# Patient Record
Sex: Female | Born: 1990 | Race: White | Hispanic: No | Marital: Single | Smoking: Current every day smoker
Health system: Southern US, Community
[De-identification: ages and names within clinical notes are randomized; demographics above are authoritative.]

---
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
## 2012-02-13 ENCOUNTER — Emergency Department: Payer: Self-pay | Admitting: Emergency Medicine

## 2012-03-13 ENCOUNTER — Emergency Department: Payer: Self-pay | Admitting: *Deleted
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
## 2014-03-07 ENCOUNTER — Emergency Department: Payer: Self-pay | Admitting: Emergency Medicine

## 2015-11-24 ENCOUNTER — Encounter: Payer: Self-pay | Admitting: Emergency Medicine

## 2015-11-24 ENCOUNTER — Emergency Department
Admission: EM | Admit: 2015-11-24 | Discharge: 2015-11-24 | Disposition: A | Discharge status: Home / Self Care | Payer: Self-pay | Attending: Emergency Medicine | Admitting: Emergency Medicine

## 2015-11-24 DIAGNOSIS — H6981 Other specified disorders of Eustachian tube, right ear: Secondary | ICD-10-CM

## 2015-11-24 DIAGNOSIS — F17210 Nicotine dependence, cigarettes, uncomplicated: Secondary | ICD-10-CM | POA: Insufficient documentation

## 2015-11-24 DIAGNOSIS — H6991 Unspecified Eustachian tube disorder, right ear: Secondary | ICD-10-CM | POA: Insufficient documentation

## 2015-11-24 MED ORDER — CETIRIZINE HCL 10 MG PO TABS: 10.0000 mg | ORAL_TABLET | Freq: Every day | ORAL | Status: DC

## 2015-11-24 MED ORDER — FLUTICASONE PROPIONATE 50 MCG/ACT NA SUSP: 1.0000 | Freq: Two times a day (BID) | NASAL | Status: DC

## 2015-11-24 NOTE — ED Provider Notes (Signed)
[facility] Emergency Department Provider Note  ____________________________________________  Time seen: Approximately 2:25 PM  I have reviewed the triage vital signs and the nursing notes.   HISTORY  Chief Complaint Otalgia    HPI Teresa Sampson is a 25 y.o. female who presents emergency department complaining of right ear "fullness and pressure." Patient states that it sounds muffled when both she and others talk. She denies any true pain to the area. She denies any fevers or chills. She denies any nasal congestion, sore throat, cough. Patient is tried ibuprofen with no relief.Patient is not inserted anything into her ear and denies any barotrauma.   History reviewed. No pertinent past medical history.  There are no active problems to display for this patient.   History reviewed. No pertinent past surgical history.  Current Outpatient Rx  Name  Route  Sig  Dispense  Refill  . cetirizine (ZYRTEC) 10 MG tablet   Oral   Take 1 tablet (10 mg total) by mouth daily.   30 tablet   0   . fluticasone (FLONASE) 50 MCG/ACT nasal spray   Each Nare   Place 1 spray into both nostrils 2 (two) times daily.   16 g   0     Allergies Review of patient's allergies indicates no known allergies.  No family history on file.  Social History Social History  Substance Use Topics  . Smoking status: Current Every Day Smoker -- 1.00 packs/day    Types: Cigarettes  . Smokeless tobacco: Never Used  . Alcohol Use: No     Review of Systems  Constitutional: No fever/chills Eyes: No visual changes. No discharge ENT: No sore throat. Positive for right ear fullness. Negative for nasal congestion. Cardiovascular: no chest pain. Respiratory: no cough. No SOB. Skin: Negative for rash. Neurological: Negative for headaches, focal weakness or numbness. 10-point ROS otherwise negative.  ____________________________________________   PHYSICAL EXAM:  VITAL  SIGNS: ED Triage Vitals  Enc Vitals Group     BP 11/24/15 1307 110/60 mmHg     Pulse Rate 11/24/15 1307 102     Resp 11/24/15 1307 16     Temp 11/24/15 1307 98 F (36.7 C)     Temp Source 11/24/15 1307 Oral     SpO2 11/24/15 1307 100 %     Weight 11/24/15 1307 100 lb (45.36 kg)     Height 11/24/15 1307  (1.575 m)     Head Cir --      Peak Flow --      Pain Score 11/24/15 1309 0     Pain Loc --      Pain Edu? --      Excl. in GC? --      Constitutional: Alert and oriented. Well appearing and in no acute distress. Eyes: Conjunctivae are normal. PERRL. EOMI. Head: Atraumatic. ENT:      Ears: EACs unremarkable bilaterally. TM on left is unremarkable. TM on right is moderately bulging but no air-fluid level is identified.      Nose: No congestion/rhinnorhea.      Mouth/Throat: Mucous membranes are moist.  Neck: No stridor.   Hematological/Lymphatic/Immunilogical: No cervical lymphadenopathy. Cardiovascular: Normal rate, regular rhythm. Normal S1 and S2.  Good peripheral circulation. Respiratory: Normal respiratory effort without tachypnea or retractions. Lungs CTAB. Neurologic:  Normal speech and language. No gross focal neurologic deficits are appreciated.  Skin:  Skin is warm, dry and intact. No rash noted. Psychiatric: Mood and affect are normal. Speech  and behavior are normal. Patient exhibits appropriate insight and judgement.   ____________________________________________   LABS (all labs ordered are listed, but only abnormal results are displayed)  Labs Reviewed - No data to display ____________________________________________  EKG   ____________________________________________  RADIOLOGY   No results found.  ____________________________________________    PROCEDURES  Procedure(s) performed:       Medications - No data to display   ____________________________________________   INITIAL IMPRESSION / ASSESSMENT AND PLAN / ED  COURSE  Pertinent labs & imaging results that were available during my care of the patient were reviewed by me and considered in my medical decision making (see chart for details).  Patient's diagnosis is consistent with eustachian tube dysfunction on the right. Patient denies any other symptoms complaints. No signs of infection at this time.. Patient will be discharged home with prescriptions for Zyrtec and Flonase. Patient is to follow up with primary care provider if symptoms persist past this treatment course. Patient is given ED precautions to return to the ED for any worsening or new symptoms.     ____________________________________________  FINAL CLINICAL IMPRESSION(S) / ED DIAGNOSES  Final diagnoses:  Eustachian tube dysfunction, right      NEW MEDICATIONS STARTED DURING THIS VISIT:  New Prescriptions   CETIRIZINE (ZYRTEC) 10 MG TABLET    Take 1 tablet (10 mg total) by mouth daily.   FLUTICASONE (FLONASE) 50 MCG/ACT NASAL SPRAY    Place 1 spray into both nostrils 2 (two) times daily.        Delorise Royals Serenitee Fuertes, PA-C 11/24/15 1431  Jeanmarie Plant, MD 11/24/15 [phone]

## 2015-11-24 NOTE — ED Notes (Signed)
C/o right ear pressure x 3 days.  Also c/o "muffled sounds" to right ear.

## 2015-11-24 NOTE — Discharge Instructions (Signed)

## 2016-06-26 ENCOUNTER — Emergency Department
Admission: EM | Admit: 2016-06-26 | Discharge: 2016-06-26 | Disposition: A | Discharge status: Home / Self Care | Payer: Self-pay | Attending: Emergency Medicine | Admitting: Emergency Medicine

## 2016-06-26 ENCOUNTER — Encounter: Payer: Self-pay | Admitting: Emergency Medicine

## 2016-06-26 DIAGNOSIS — Z7951 Long term (current) use of inhaled steroids: Secondary | ICD-10-CM | POA: Insufficient documentation

## 2016-06-26 DIAGNOSIS — Z79899 Other long term (current) drug therapy: Secondary | ICD-10-CM | POA: Insufficient documentation

## 2016-06-26 DIAGNOSIS — J039 Acute tonsillitis, unspecified: Secondary | ICD-10-CM | POA: Insufficient documentation

## 2016-06-26 DIAGNOSIS — F17210 Nicotine dependence, cigarettes, uncomplicated: Secondary | ICD-10-CM | POA: Insufficient documentation

## 2016-06-26 LAB — POCT RAPID STREP A: Streptococcus, Group A Screen (Direct): NEGATIVE

## 2016-06-26 MED ORDER — FIRST-DUKES MOUTHWASH MT SUSP: 5.0000 mL | Freq: Four times a day (QID) | OROMUCOSAL | 0 refills | Status: DC | PRN

## 2016-06-26 MED ORDER — AMOXICILLIN 400 MG/5ML PO SUSR: 875.0000 mg | Freq: Two times a day (BID) | ORAL | 0 refills | Status: DC

## 2016-06-26 NOTE — ED Provider Notes (Signed)
[facility] Emergency Department Provider Note  ____________________________________________  Time seen: Approximately 7:55 AM  I have reviewed the triage vital signs and the nursing notes.   HISTORY  Chief Complaint Sore Throat    HPI Teresa Sampson is a 25 y.o. female , NAD, presents to the emergency department today history of sore throat and fevers. States she had a fever of 102.12F 2 nights ago. Took ibuprofen which alleviated the fever. Has had sore throat and Eisenstein patches on her tonsils since that time. Denies any nasal congestion, runny nose, ear pain, ear drainage, sinus pressure, headache, cough or chest congestion. Denies any abdominal pain, nausea, vomiting. Has not noted any rashes. No known sick contacts.    History reviewed. No pertinent past medical history.  There are no active problems to display for this patient.   History reviewed. No pertinent surgical history.  Prior to Admission medications   Medication Sig Start Date End Date Taking? Authorizing Provider  amoxicillin (AMOXIL) 400 MG/5ML suspension Take 10.9 mLs (875 mg total) by mouth 2 (two) times daily. 06/26/16   Anielle Headrick L Abria Vannostrand, PA-C  cetirizine (ZYRTEC) 10 MG tablet Take 1 tablet (10 mg total) by mouth daily. 11/24/15   Delorise RoyalsJonathan D Cuthriell, PA-C  Diphenhyd-Hydrocort-Nystatin (FIRST-DUKES MOUTHWASH) SUSP Use as directed 5 mLs in the mouth or throat 4 (four) times daily as needed (for throat pain). 06/26/16   Paizlie Klaus L Sahar Ryback, PA-C  fluticasone (FLONASE) 50 MCG/ACT nasal spray Place 1 spray into both nostrils 2 (two) times daily. 11/24/15   Delorise RoyalsJonathan D Cuthriell, PA-C    Allergies Review of patient's allergies indicates no known allergies.  No family history on file.  Social History Social History  Substance Use Topics  . Smoking status: Current Every Day Smoker    Packs/day: 1.00    Types: Cigarettes  . Smokeless tobacco: Never Used  . Alcohol use No     Review of Systems   Constitutional: Positive fever with MAXIMUM TEMPERATURE of 102.12F. No chills, rigors, fatigue. Eyes: No visual changes. No discharge, redness, pain ENT: Positive sore throat with Hachey patches on tonsils. No sinus pressure, ear pain, ear drainage, nasal congestion, runny nose Cardiovascular: No chest pain. Respiratory: No cough or chest congestion. No shortness of breath. No wheezing.  Gastrointestinal: No abdominal pain.  No nausea, vomiting.   Musculoskeletal: Negative for general myalgias.  Skin: Negative for rash. Neurological: Negative for headaches. 10-point ROS otherwise negative.  ____________________________________________   PHYSICAL EXAM:  VITAL SIGNS: ED Triage Vitals  Enc Vitals Group     BP 06/26/16 0747 113/69     Pulse Rate 06/26/16 0747 (!) 125     Resp 06/26/16 0747 16     Temp 06/26/16 0747 98.1 F (36.7 C)     Temp Source 06/26/16 0747 Oral     SpO2 06/26/16 0747 98 %     Weight 06/26/16 0748 100 lb (45.4 kg)     Height 06/26/16 0748 5\' 1"  (1.549 m)     Head Circumference --      Peak Flow --      Pain Score 06/26/16 0748 7     Pain Loc --      Pain Edu? --      Excl. in GC? --     Constitutional: Alert and oriented. Well appearing and in no acute distress. Eyes: Conjunctivae are normal without icterus or injection. No drainage. Head: Atraumatic. ENT:      Nose: No congestion/rhinnorhea.  Mouth/Throat: Bilateral tonsils with mild erythema, Corning exudate and trace swelling but a patent airway. No postnasal drip. Uvula is midline. Mucous membranes are moist.  Neck: No stridor, carotid bruits. Supple with full range of motion. Hematological/Lymphatic/Immunilogical: Positive right, anterior, focal cervical lymphadenopathy without pain to palpation and is mobile. Cardiovascular: Normal rate, with manual pulse measured at 98 bpm, regular rhythm. Normal S1 and S2.  Good peripheral circulation. Respiratory: Normal respiratory effort without tachypnea or  retractions. Lungs CTAB with breath sounds noted in all lung fields. No wheeze, rhonchi, rales. Neurologic:  Normal speech and language. No gross focal neurologic deficits are appreciated.  Skin:  Skin is warm, dry and intact. No rash noted. Psychiatric: Mood and affect are normal. Speech and behavior are normal. Patient exhibits appropriate insight and judgement.   ____________________________________________   LABS (all labs ordered are listed, but only abnormal results are displayed)  Labs Reviewed  POCT RAPID STREP A   ____________________________________________  EKG  None ____________________________________________  RADIOLOGY  None ____________________________________________    PROCEDURES  Procedure(s) performed: None   Procedures   Medications - No data to display   ____________________________________________   INITIAL IMPRESSION / ASSESSMENT AND PLAN / ED COURSE  Pertinent labs & imaging results that were available during my care of the patient were reviewed by me and considered in my medical decision making (see chart for details).  Clinical Course  Comment By Time  Pulse rate taken manually and noted to be 98 bpm. Hope Pigeon, PA-C 09/17 0840  Considering patient has physical exam finding of tonsillitis, I will be treating her with amoxicillin to cover for potential strep. Strep culture would not be sent to lab today considering patient would be treated today. Hope Pigeon, PA-C 09/17 1610    Patient's diagnosis is consistent with Acute tonsillitis. 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 Patient will be discharged home with prescriptions for liquid amoxicillin as well as first Dukes Magic mouthwash to take as directed.  May continue Tylenol or ibuprofen over-the-counter as needed for fever or aches. Patient is to follow up with [facility] community  clinic if symptoms persist past this treatment course. Patient is given ED precautions to return to the ED for any worsening or new symptoms.    ____________________________________________  FINAL CLINICAL IMPRESSION(S) / ED DIAGNOSES  Final diagnoses:  Acute tonsillitis, unspecified etiology      NEW MEDICATIONS STARTED DURING THIS VISIT:  Discharge Medication List as of 06/26/2016  8:40 AM    START taking these medications   Details  amoxicillin (AMOXIL) 400 MG/5ML suspension Take 10.9 mLs (875 mg total) by mouth 2 (two) times daily., Starting Sun 06/26/2016, Print    Diphenhyd-Hydrocort-Nystatin (FIRST-DUKES MOUTHWASH) SUSP Use as directed 5 mLs in the mouth or throat 4 (four) times daily as needed (for throat pain)., Starting Sun 06/26/2016, [employer], PA-C 06/26/16 9604    Sharman Cheek, MD 06/28/16 2231

## 2016-06-26 NOTE — ED Notes (Signed)
Sore throat / fever x2 days , last ibuprofen 4 hours PTA

## 2016-06-26 NOTE — ED Triage Notes (Signed)
C/O sore throat and fever x 2 days.  Ibuprofen last taken this morning at 0430

## 2016-06-26 NOTE — ED Notes (Signed)
Pt in no distress, ambulatory

## 2016-06-26 NOTE — ED Notes (Signed)
Provider at bedside

## 2017-09-23 ENCOUNTER — Emergency Department
Admission: EM | Admit: 2017-09-23 | Discharge: 2017-09-23 | Disposition: A | Discharge status: Home / Self Care | Payer: Self-pay | Attending: Emergency Medicine | Admitting: Emergency Medicine

## 2017-09-23 ENCOUNTER — Other Ambulatory Visit: Payer: Self-pay

## 2017-09-23 ENCOUNTER — Encounter: Payer: Self-pay | Admitting: Physician Assistant

## 2017-09-23 DIAGNOSIS — K029 Dental caries, unspecified: Secondary | ICD-10-CM | POA: Insufficient documentation

## 2017-09-23 DIAGNOSIS — F17210 Nicotine dependence, cigarettes, uncomplicated: Secondary | ICD-10-CM | POA: Insufficient documentation

## 2017-09-23 MED ORDER — IBUPROFEN 800 MG PO TABS: 800.0000 mg | ORAL_TABLET | Freq: Three times a day (TID) | ORAL | 0 refills | Status: DC | PRN

## 2017-09-23 MED ORDER — AMOXICILLIN 500 MG PO CAPS: 500.0000 mg | ORAL_CAPSULE | Freq: Three times a day (TID) | ORAL | 0 refills | Status: DC

## 2017-09-23 NOTE — ED Triage Notes (Signed)
Patient reports pain to left lower jaw.

## 2017-09-23 NOTE — Discharge Instructions (Signed)
OPTIONS FOR DENTAL FOLLOW UP CARE ° °[place] Department of Health and Human Services - Local Safety Net Dental Clinics °http://www.ncdhhs.gov/dph/oralhealth/services/safetynetclinics.htm °  °[facility] (336-562-3123) ° °[facility] [place] (919-933-9087) ° °[facility] ([phone] ext [phone]) ° °[facility] ([phone]) ° °[facility] ([phone]) °This clinic caters to the indigent population and is on a lottery system. °Location: °[facility], [address], [place] °Clinic Hours: °Wednesdays from 6pm - 9pm, patients seen by a lottery system. °For dates, call or go to www.med.[facility].edu/[facility]/patients/Dental-[facility] °Services: °Cleanings, fillings and simple extractions. °Payment Options: °DENTAL WORK IS FREE OF CHARGE. Bring proof of income or support. °Best way to get seen: °Arrive at 5:15 pm - this is a lottery, NOT first come/first serve, so arriving earlier will not increase your chances of being seen. °  °  °[facility] °[phone] °Select option 1 for emergencies °  °Location: °[facility], [address], [place] °Clinic Hours: °No walk-ins accepted - call the day before to schedule an appointment. °Check in times are 9:30 am and 1:30 pm. °Services: °Simple extractions, temporary fillings, pulpectomy/pulp debridement, uncomplicated abscess drainage. °Payment Options: °PAYMENT IS DUE AT THE TIME OF SERVICE.  Fee is usually $100-200, additional surgical procedures (e.g. abscess drainage) may be extra. °Cash, checks, Visa/MasterCard accepted.  Can file Medicaid if patient is covered for dental - patient should call case worker to check. °No discount for [facility] patients. °Best way to get seen: °MUST call the day before and get onto the schedule. Can usually be seen the next 1-2 days. No walk-ins accepted. °  °  °[facility] °[phone] °   °Location: °[facility], [address], [place] °Clinic Hours: °M, W, Th, F 8am or 1:30pm, Tues 9a or 1:30 - first come/first served. °Services: °Simple extractions, temporary fillings, uncomplicated abscess drainage.  You do not need to be an [place] resident. °Payment Options: °PAYMENT IS DUE AT THE TIME OF SERVICE. °Dental insurance, otherwise sliding scale - bring proof of income or support. °Depending on income and treatment needed, cost is usually $50-200. °Best way to get seen: °Arrive early as it is first come/first served. °  °  °[facility] °[phone] °  °Location: °[address] °Clinic Hours: °Mon-Thu 8a-5p °Services: °Most basic dental services including extractions and fillings. °Payment Options: °PAYMENT IS DUE AT THE TIME OF SERVICE. °Sliding scale, up to 50% off - bring proof if income or support. °Medicaid with dental option accepted. °Best way to get seen: °Call to schedule an appointment, can usually be seen within 2 weeks OR they will try to see walk-ins - show up at 8a or 2p (you may have to wait). °  °  °[facility] °919-245-2435 °[place] RESIDENTS ONLY °  °Location: °[facility], [address], [place], [place] [address] °Clinic Hours: By appointment only. °Monday - Thursday 8am-5pm, Friday 8am-12pm °Services: Cleanings, fillings, extractions. °Payment Options: °PAYMENT IS DUE AT THE TIME OF SERVICE. °Cash, Visa or MasterCard. Sliding scale - $30 minimum per service. °Best way to get seen: °Come in to office, complete packet and make an appointment - need proof of income °or support monies for each household member and proof of [place] residence. °Usually takes about a month to get in. °  °  °[facility] °[phone] °  °Location: °[address]   [place] °Clinic Hours: Walk-in Urgent Care Dental Services are offered Monday-Friday  mornings only. °The numbers of emergencies accepted daily is limited to the number of °providers available. °Maximum 15 - Mondays, Wednesdays & Thursdays °Maximum 10 - Tuesdays & Fridays °Services: °You do not need to be a [place] resident to be seen for a dental emergency. °Emergencies are defined as pain, swelling, abnormal bleeding, or dental trauma. Walkins will receive x-rays if needed. °NOTE: Dental cleaning is not an emergency. °Payment Options: °PAYMENT IS DUE AT THE TIME OF SERVICE. °Minimum co-pay is $40.00 for uninsured patients. °Minimum co-pay is $3.00 for Medicaid with dental coverage. °Dental Insurance is accepted and must be presented at time of visit. °Medicare does not cover dental. °Forms of payment: Cash, credit card, checks. °Best way to get seen: °If not previously registered with the clinic, walk-in dental registration begins at 7:15 am and is on a first come/first serve basis. °If previously registered with the clinic, call to make an appointment. °  °  °[facility] °[phone] °[place] RESIDENTS ONLY °  °Location: °[address], [place], [place] °Clinic Hours: °Mon-Thu 10a-2p °Services: Extractions only! °Payment Options: °FREE (donations accepted) - bring proof of income or support °Best way to get seen: °Call and schedule an appointment OR come at 8am on the 1st Monday of every month (except for holidays) when it is first come/first served. °  °  °[facility] °[phone] °  °Location: °[address], [place] °Clinic Hours: °Friday mornings °Services, Payment Options, Best way to get seen: °Call for info °

## 2017-09-23 NOTE — ED Provider Notes (Signed)
[facility] Emergency Department Provider Note  ____________________________________________   First MD Initiated Contact with Patient 09/23/17 2256     (approximate)  I have reviewed the triage vital signs and the nursing notes.   HISTORY  Chief Complaint Dental Pain    HPI Teresa Sampson is a 26 y.o. female complains of swelling to the left lower jaw, she states she has several cavities that need to be addressed, she denies fever, chills, chest pain or shortness of breath, she does not have a regular dentist   History reviewed. No pertinent past medical history.  There are no active problems to display for this patient.   History reviewed. No pertinent surgical history.  Prior to Admission medications   Medication Sig Start Date End Date Taking? Authorizing Provider  amoxicillin (AMOXIL) 500 MG capsule Take 1 capsule (500 mg total) by mouth 3 (three) times daily. 09/23/17   Kenyata Napier, Roselyn BeringSusan W, PA-C  ibuprofen (ADVIL,MOTRIN) 800 MG tablet Take 1 tablet (800 mg total) by mouth every 8 (eight) hours as needed. 09/23/17   Faythe GheeFisher, Jennipher Weatherholtz W, PA-C    Allergies Patient has no known allergies.  No family history on file.  Social History Social History   Tobacco Use  . Smoking status: Current Every Day Smoker    Packs/day: 1.00    Types: Cigarettes  . Smokeless tobacco: Never Used  Substance Use Topics  . Alcohol use: No  . Drug use: No    Review of Systems  Constitutional: No fever/chills Eyes: No visual changes. ENT: No sore throat.  Positive for tooth pain and jaw swelling Respiratory: Denies cough Genitourinary: Negative for dysuria. Musculoskeletal: Negative for back pain. Skin: Negative for rash.    ____________________________________________   PHYSICAL EXAM:  VITAL SIGNS: ED Triage Vitals  Enc Vitals Group     BP 09/23/17 2239 116/73     Pulse Rate 09/23/17 2239 (!) 107     Resp 09/23/17 2239 18     Temp 09/23/17 2239  98.8 F (37.1 C)     Temp Source 09/23/17 2239 Oral     SpO2 09/23/17 2239 99 %     Weight 09/23/17 2239 110 lb (49.9 kg)     Height 09/23/17 2239 5\' 2"  (1.575 m)     Head Circumference --      Peak Flow --      Pain Score 09/23/17 2252 2     Pain Loc --      Pain Edu? --      Excl. in GC? --     Constitutional: Alert and oriented. Well appearing and in no acute distress. Eyes: Conjunctivae are normal.  Head: Atraumatic.  Mild swelling on the left mandible Nose: No congestion/rhinnorhea. Mouth/Throat: Mucous membranes are moist.  Throat is normal, patient has poor dentition, several dental caries throughout her mouth, the gum is swollen on the left lower molar Cardiovascular: Normal rate, regular rhythm.  Heart sounds are normal Respiratory: Normal respiratory effort.  No retractions lungs are clear to auscultation,  GU: deferred Musculoskeletal: FROM all extremities, warm and well perfused Neurologic:  Normal speech and language.  Skin:  Skin is warm, dry and intact. No rash noted. Psychiatric: Mood and affect are normal. Speech and behavior are normal.  ____________________________________________   LABS (all labs ordered are listed, but only abnormal results are displayed)  Labs Reviewed - No data to display ____________________________________________   ____________________________________________  RADIOLOGY    ____________________________________________   PROCEDURES  Procedure(s)  performed: No      ____________________________________________   INITIAL IMPRESSION / ASSESSMENT AND PLAN / ED COURSE  Pertinent labs & imaging results that were available during my care of the patient were reviewed by me and considered in my medical decision making (see chart for details).  Patient is a 26 year old female with severe dental caries, she does not have a regular dentist, there is swelling along the left lower gum at the molar, minimal swelling at the left  jaw, with prescription for amoxicillin 500 mg 3 times a day for 10 days, ibuprofen 800 mg 3 times a day with food, patient was given follow-up information for dental clinics in the area, she states she will call and follow-up, she was discharged in stable condition      ____________________________________________   FINAL CLINICAL IMPRESSION(S) / ED DIAGNOSES  Final diagnoses:  Pain due to dental caries      NEW MEDICATIONS STARTED DURING THIS VISIT:  This SmartLink is deprecated. Use AVSMEDLIST instead to display the medication list for a patient.   Note:  This document was prepared using Dragon voice recognition software and may include unintentional dictation errors.    Faythe GheeFisher, Miken Stecher W, PA-C 09/23/17 2307    Jeanmarie PlantMcShane, James A, MD 09/24/17 2226

## 2018-07-19 ENCOUNTER — Emergency Department
Admission: EM | Admit: 2018-07-19 | Discharge: 2018-07-19 | Disposition: A | Discharge status: Home / Self Care | Payer: Self-pay | Attending: Emergency Medicine | Admitting: Emergency Medicine

## 2018-07-19 ENCOUNTER — Encounter: Payer: Self-pay | Admitting: Emergency Medicine

## 2018-07-19 ENCOUNTER — Other Ambulatory Visit: Payer: Self-pay

## 2018-07-19 DIAGNOSIS — F17210 Nicotine dependence, cigarettes, uncomplicated: Secondary | ICD-10-CM | POA: Insufficient documentation

## 2018-07-19 DIAGNOSIS — K047 Periapical abscess without sinus: Secondary | ICD-10-CM

## 2018-07-19 MED ORDER — AMOXICILLIN 250 MG/5ML PO SUSR
1000.0000 mg | Freq: Once | ORAL | Status: AC
  Administered 2018-07-19: 1000 mg via ORAL
  Filled 2018-07-19: qty 20

## 2018-07-19 MED ORDER — MAGIC MOUTHWASH W/LIDOCAINE: 5.0000 mL | Freq: Four times a day (QID) | ORAL | 0 refills | Status: DC

## 2018-07-19 MED ORDER — AMOXICILLIN 400 MG/5ML PO SUSR: 1000.0000 mg | Freq: Two times a day (BID) | ORAL | 0 refills | Status: DC

## 2018-07-19 NOTE — ED Notes (Signed)
See triage note  Present with possible dental abscess and pain to left side  sxs' started 2 days ago

## 2018-07-19 NOTE — ED Provider Notes (Signed)
[facility] Emergency Department Provider Note  ____________________________________________  Time seen: Approximately 2:20 PM  I have reviewed the triage vital signs and the nursing notes.   HISTORY  Chief Complaint No chief complaint on file.    HPI Teresa Sampson is a 27 y.o. female who presents the emergency department complaining of left lower dental pain and swelling.  Patient reports that she has poor dentition, but does not have a dentist.  She reports that she has had dental infections in the past with similar symptoms.  No fevers or chills, nasal congestion, sore throat, difficulty breathing or swallowing.  Patient is tried Motrin and BC powder at home for symptomatic control.  2 days of symptoms.  No other complaints at this time.    History reviewed. No pertinent past medical history.  There are no active problems to display for this patient.   History reviewed. No pertinent surgical history.  Prior to Admission medications   Medication Sig Start Date End Date Taking? Authorizing Provider  amoxicillin (AMOXIL) 400 MG/5ML suspension Take 12.5 mLs (1,000 mg total) by mouth 2 (two) times daily. 07/19/18   Nimra Puccinelli, Delorise Royals, PA-C  ibuprofen (ADVIL,MOTRIN) 800 MG tablet Take 1 tablet (800 mg total) by mouth every 8 (eight) hours as needed. 09/23/17   Faythe Ghee, PA-C  magic mouthwash w/lidocaine SOLN Take 5 mLs by mouth 4 (four) times daily. 07/19/18   Rebacca Votaw, Delorise Royals, PA-C    Allergies Patient has no known allergies.  No family history on file.  Social History Social History   Tobacco Use  . Smoking status: Current Every Day Smoker    Packs/day: 1.00    Types: Cigarettes  . Smokeless tobacco: Never Used  Substance Use Topics  . Alcohol use: No  . Drug use: No     Review of Systems  Constitutional: No fever/chills Eyes: No visual changes. No discharge ENT: Positive for left lower dental pain and  swelling. Cardiovascular: no chest pain. Respiratory: no cough. No SOB. Gastrointestinal: No abdominal pain.  No nausea, no vomiting.  Mild musculoskeletal: Negative for musculoskeletal pain. Skin: Negative for rash, abrasions, lacerations, ecchymosis. Neurological: Negative for headaches, focal weakness or numbness. 10-point ROS otherwise negative.  ____________________________________________   PHYSICAL EXAM:  VITAL SIGNS: ED Triage Vitals  Enc Vitals Group     BP 07/19/18 1258 107/64     Pulse Rate 07/19/18 1258 (!) 105     Resp 07/19/18 1258 18     Temp 07/19/18 1258 98.8 F (37.1 C)     Temp Source 07/19/18 1258 Oral     SpO2 07/19/18 1258 100 %     Weight 07/19/18 1259 99 lb (44.9 kg)     Height 07/19/18 1259 5\' 3"  (1.6 m)     Head Circumference --      Peak Flow --      Pain Score 07/19/18 1300 8     Pain Loc --      Pain Edu? --      Excl. in GC? --      Constitutional: Alert and oriented. Well appearing and in no acute distress. Eyes: Conjunctivae are normal. PERRL. EOMI. Head: Atraumatic. ENT:      Ears:       Nose: No congestion/rhinnorhea.      Mouth/Throat: Mucous membranes are moist.  Poor dentition throughout with multiple dental erosions, cavities.  Patient does have erythema and edema surrounding multiple left lower teeth.  No fluctuance or induration.  No purulent drainage.  No indication of deep space infection or Ludwig angina.  Uvula is midline. Neck: No stridor.   Hematological/Lymphatic/Immunilogical: No cervical lymphadenopathy. Cardiovascular: Normal rate, regular rhythm. Normal S1 and S2.  Good peripheral circulation. Respiratory: Normal respiratory effort without tachypnea or retractions. Lungs CTAB. Good air entry to the bases with no decreased or absent breath sounds. Musculoskeletal: Full range of motion to all extremities. No gross deformities appreciated. Neurologic:  Normal speech and language. No gross focal neurologic deficits are  appreciated.  Skin:  Skin is warm, dry and intact. No rash noted. Psychiatric: Mood and affect are normal. Speech and behavior are normal. Patient exhibits appropriate insight and judgement.   ____________________________________________   LABS (all labs ordered are listed, but only abnormal results are displayed)  Labs Reviewed - No data to display ____________________________________________  EKG   ____________________________________________  RADIOLOGY   No results found.  ____________________________________________    PROCEDURES  Procedure(s) performed:    Procedures    Medications  amoxicillin (AMOXIL) 250 MG/5ML suspension 1,000 mg (has no administration in time range)     ____________________________________________   INITIAL IMPRESSION / ASSESSMENT AND PLAN / ED COURSE  Pertinent labs & imaging results that were available during my care of the patient were reviewed by me and considered in my medical decision making (see chart for details).  Review of the [place] CSRS was performed in accordance of the [facility] prior to dispensing any controlled drugs.      Patient's diagnosis is consistent with dental infection.  Patient presents the emergency department complaining of left lower dental pain and swelling.  Findings are consistent with dental infection.  No indication for labs or imaging at this time.  Patient is unable to swallow pills and request liquid medication.  Patient will be prescribed amoxicillin, Magic mouthwash.  Follow-up with dentist..  Patient is given ED precautions to return to the ED for any worsening or new symptoms.     ____________________________________________  FINAL CLINICAL IMPRESSION(S) / ED DIAGNOSES  Final diagnoses:  Dental abscess      NEW MEDICATIONS STARTED DURING THIS VISIT:  ED Discharge Orders         Ordered    magic mouthwash w/lidocaine SOLN  4 times daily    Note to Pharmacy:  Dispense in a 1/1/1 ratio. Use  lidocaine, diphenhydramine, prednisolone   07/19/18 1428    amoxicillin (AMOXIL) 400 MG/5ML suspension  2 times daily     07/19/18 1428              This chart was dictated using voice recognition software/Dragon. Despite best efforts to proofread, errors can occur which can change the meaning. Any change was purely unintentional.    Racheal Patches, PA-C 07/19/18 1430    Dionne Bucy, MD 07/19/18 1534

## 2018-07-19 NOTE — ED Triage Notes (Signed)
Pt c/o dental pain to LFT side x2 days, denies fever. VSS

## 2018-07-19 NOTE — Discharge Instructions (Signed)
OPTIONS FOR DENTAL FOLLOW UP CARE ° °[place] Department of Health and Human Services - Local Safety Net Dental Clinics °http://www.ncdhhs.gov/dph/oralhealth/services/safetynetclinics.htm °  °[facility] ([phone]) ° °[facility] [place] ([phone]) ° °[facility] ([phone]) ° °[facility] ([phone]) ° °[facility] ([phone]) °This clinic caters to the indigent population and is on a lottery system. °Location: °[facility], [address], [place] °Clinic Hours: °Wednesdays from 6pm - 9pm, patients seen by a lottery system. °For dates, call or go to [email or web address] °Services: °Cleanings, fillings and simple extractions. °Payment Options: °DENTAL WORK IS FREE OF CHARGE. Bring proof of income or support. °Best way to get seen: °Arrive at 5:15 pm - this is a lottery, NOT first come/first serve, so arriving earlier will not increase your chances of being seen. °  °  °[facility] °[phone] °Select option 1 for emergencies °  °Location: °[facility], [address], [place] °Clinic Hours: °No walk-ins accepted - call the day before to schedule an appointment. °Check in times are 9:30 am and 1:30 pm. °Services: °Simple extractions, temporary fillings, pulpectomy/pulp debridement, uncomplicated abscess drainage. °Payment Options: °PAYMENT IS DUE AT THE TIME OF SERVICE.  Fee is usually $100-200, additional surgical procedures (e.g. abscess drainage) may be extra. °Cash, checks, Visa/MasterCard accepted.  Can file Medicaid if patient is covered for dental - patient should call case worker to check. °No discount for [facility] patients. °Best way to get seen: °MUST call the day before and get onto the schedule. Can usually be seen the next 1-2 days. No walk-ins accepted. °  °  °[facility] °[phone] °   °Location: °[facility], [address], [place] °Clinic Hours: °M, W, Th, F 8am or 1:30pm, Tues 9a or 1:30 - first come/first served. °Services: °Simple extractions, temporary fillings, uncomplicated abscess drainage.  You do not need to be an [place] resident. °Payment Options: °PAYMENT IS DUE AT THE TIME OF SERVICE. °Dental insurance, otherwise sliding scale - bring proof of income or support. °Depending on income and treatment needed, cost is usually $50-200. °Best way to get seen: °Arrive early as it is first come/first served. °  °  °[facility] °[phone] °  °Location: °[address] °Clinic Hours: °Mon-Thu 8a-5p °Services: °Most basic dental services including extractions and fillings. °Payment Options: °PAYMENT IS DUE AT THE TIME OF SERVICE. °Sliding scale, up to 50% off - bring proof if income or support. °Medicaid with dental option accepted. °Best way to get seen: °Call to schedule an appointment, can usually be seen within 2 weeks OR they will try to see walk-ins - show up at 8a or 2p (you may have to wait). °  °  °[facility] °[phone] °[place] RESIDENTS ONLY °  °Location: °[facility], [address], [place], [place] [address] °Clinic Hours: By appointment only. °Monday - Thursday 8am-5pm, Friday 8am-12pm °Services: Cleanings, fillings, extractions. °Payment Options: °PAYMENT IS DUE AT THE TIME OF SERVICE. °Cash, Visa or MasterCard. Sliding scale - $30 minimum per service. °Best way to get seen: °Come in to office, complete packet and make an appointment - need proof of income °or support monies for each household member and proof of [place] residence. °Usually takes about a month to get in. °  °  °[facility] °[phone] °  °Location: °[address]   [place] °Clinic Hours: Walk-in Urgent Care Dental Services are offered Monday-Friday  mornings only. °The numbers of emergencies accepted daily is limited to the number of °providers available. °Maximum 15 - Mondays, Wednesdays & Thursdays °Maximum 10 - Tuesdays & Fridays °Services: °You do not need to be a [place] resident to be seen for a dental emergency. °Emergencies are defined as pain, swelling, abnormal bleeding, or dental trauma. Walkins will receive x-rays if needed. °NOTE: Dental cleaning is not an emergency. °Payment Options: °PAYMENT IS DUE AT THE TIME OF SERVICE. °Minimum co-pay is $40.00 for uninsured patients. °Minimum co-pay is $3.00 for Medicaid with dental coverage. °Dental Insurance is accepted and must be presented at time of visit. °Medicare does not cover dental. °Forms of payment: Cash, credit card, checks. °Best way to get seen: °If not previously registered with the clinic, walk-in dental registration begins at 7:15 am and is on a first come/first serve basis. °If previously registered with the clinic, call to make an appointment. °  °  °[facility] °[phone] °[place] RESIDENTS ONLY °  °Location: °[address], [place], [place] °Clinic Hours: °Mon-Thu 10a-2p °Services: Extractions only! °Payment Options: °FREE (donations accepted) - bring proof of income or support °Best way to get seen: °Call and schedule an appointment OR come at 8am on the 1st Monday of every month (except for holidays) when it is first come/first served. °  °  °[facility] °[phone] °  °Location: °[address], [place] °Clinic Hours: °Friday mornings °Services, Payment Options, Best way to get seen: °Call for info °

## 2018-09-11 ENCOUNTER — Other Ambulatory Visit: Payer: Self-pay

## 2018-09-11 ENCOUNTER — Emergency Department
Admission: EM | Admit: 2018-09-11 | Discharge: 2018-09-11 | Disposition: A | Discharge status: Home / Self Care | Payer: Self-pay | Attending: Emergency Medicine | Admitting: Emergency Medicine

## 2018-09-11 DIAGNOSIS — K029 Dental caries, unspecified: Secondary | ICD-10-CM | POA: Insufficient documentation

## 2018-09-11 DIAGNOSIS — F17210 Nicotine dependence, cigarettes, uncomplicated: Secondary | ICD-10-CM | POA: Insufficient documentation

## 2018-09-11 MED ORDER — IBUPROFEN 800 MG PO TABS: 800.0000 mg | ORAL_TABLET | Freq: Three times a day (TID) | ORAL | 0 refills | Status: DC | PRN

## 2018-09-11 MED ORDER — AMOXICILLIN 500 MG PO CAPS: 500.0000 mg | ORAL_CAPSULE | Freq: Three times a day (TID) | ORAL | 0 refills | Status: DC

## 2018-09-11 NOTE — ED Triage Notes (Signed)
C/o right dental pain, facial pressure. Pt alert and oriented X4, active, cooperative, pt in NAD. RR even and unlabored, color WNL.

## 2018-09-11 NOTE — ED Notes (Addendum)
FIRST NURSE NOTE: Pt to ED reporting pain in sinuses and jaw pain since last night with swelling noted to the right side of pts jaw. Voice clear in lobby. No SOB or increased WOB. NO throat swelling.

## 2018-09-11 NOTE — ED Provider Notes (Signed)
[facility] Emergency Department Provider Note  ____________________________________________   First MD Initiated Contact with Patient 09/11/18 1504     (approximate)  I have reviewed the triage vital signs and the nursing notes.   HISTORY  Chief Complaint Facial Pain and Dental Pain    HPI Teresa Sampson is a 27 y.o. female presents to the emergency department complaining of right lower dental pain and sinus pressure.  She denies any fever or chills.  She denies any swelling.  States she is very anxious due to the being here in the ED.  She is not requesting any narcotics.    History reviewed. No pertinent past medical history.  There are no active problems to display for this patient.   History reviewed. No pertinent surgical history.  Prior to Admission medications   Medication Sig Start Date End Date Taking? Authorizing Provider  amoxicillin (AMOXIL) 500 MG capsule Take 1 capsule (500 mg total) by mouth 3 (three) times daily. 09/11/18   Circe Chilton, Roselyn BeringSusan W, PA-C  ibuprofen (ADVIL,MOTRIN) 800 MG tablet Take 1 tablet (800 mg total) by mouth every 8 (eight) hours as needed. 09/11/18   Faythe GheeFisher, Erik Nessel W, PA-C    Allergies Patient has no known allergies.  No family history on file.  Social History Social History   Tobacco Use  . Smoking status: Current Every Day Smoker    Packs/day: 1.00    Types: Cigarettes  . Smokeless tobacco: Never Used  Substance Use Topics  . Alcohol use: No  . Drug use: No    Review of Systems  Constitutional: No fever/chills Eyes: No visual changes. ENT: No sore throat.  Positive dental pain and sinus pressure Respiratory: Denies cough Genitourinary: Negative for dysuria. Musculoskeletal: Negative for back pain. Skin: Negative for rash.    ____________________________________________   PHYSICAL EXAM:  VITAL SIGNS: ED Triage Vitals  Enc Vitals Group     BP 09/11/18 1455 (!) 101/55     Pulse Rate 09/11/18  1455 (!) 111     Resp 09/11/18 1455 18     Temp 09/11/18 1455 97.7 F (36.5 C)     Temp Source 09/11/18 1455 Oral     SpO2 09/11/18 1455 98 %     Weight 09/11/18 1456 100 lb (45.4 kg)     Height 09/11/18 1456 5\' 2"  (1.575 m)     Head Circumference --      Peak Flow --      Pain Score 09/11/18 1456 5     Pain Loc --      Pain Edu? --      Excl. in GC? --     Constitutional: Alert and oriented. Well appearing and in no acute distress. Eyes: Conjunctivae are normal.  Head: Atraumatic.  Maxillary sinuses are tender to palpation Nose: No congestion/rhinnorhea. Mouth/Throat: Mucous membranes are moist.  Positive for poor dentition. Neck:  supple no lymphadenopathy noted Cardiovascular: Normal rate, regular rhythm. Heart sounds are normal Respiratory: Normal respiratory effort.  No retractions, lungs c t a  GU: deferred Musculoskeletal: FROM all extremities, warm and well perfused Neurologic:  Normal speech and language.  Skin:  Skin is warm, dry and intact. No rash noted. Psychiatric: Mood and affect are normal. Speech and behavior are normal.  ____________________________________________   LABS (all labs ordered are listed, but only abnormal results are displayed)  Labs Reviewed - No data to display ____________________________________________   ____________________________________________  RADIOLOGY    ____________________________________________   PROCEDURES  Procedure(s) performed: No  Procedures    ____________________________________________   INITIAL IMPRESSION / ASSESSMENT AND PLAN / ED COURSE  Pertinent labs & imaging results that were available during my care of the patient were reviewed by me and considered in my medical decision making (see chart for details).   Patient is 27 year old female presents for sinus pain and dental pain. Physical exam shows poor dentition.  Maxillary sinuses are minimally tender Explained findings to the patient.  She  is given prescription for amoxicillin ibuprofen.  She is to follow-up when the dental clinics to have the tooth extracted.  She states she understands will comply.  She is discharged in stable condition.     As part of my medical decision making, I reviewed the following data within the electronic MEDICAL RECORD NUMBER Nursing notes reviewed and incorporated, Old chart reviewed, Notes from prior ED visits and [place] Controlled Substance Database  ____________________________________________   FINAL CLINICAL IMPRESSION(S) / ED DIAGNOSES  Final diagnoses:  Pain due to dental caries      NEW MEDICATIONS STARTED DURING THIS VISIT:  New Prescriptions   AMOXICILLIN (AMOXIL) 500 MG CAPSULE    Take 1 capsule (500 mg total) by mouth 3 (three) times daily.   IBUPROFEN (ADVIL,MOTRIN) 800 MG TABLET    Take 1 tablet (800 mg total) by mouth every 8 (eight) hours as needed.     Note:  This document was prepared using Dragon voice recognition software and may include unintentional dictation errors.    Faythe Ghee, PA-C 09/11/18 1657    Emily Filbert, MD 09/12/18 [phone]

## 2018-09-11 NOTE — ED Notes (Signed)
See triage note  States she felt like she her upper teeth may be infected  Also having some swelling pain to right jaw area  No fever

## 2018-09-11 NOTE — Discharge Instructions (Addendum)
Follow-up with 1 of the following dental clinics or [facility].  Take medication as prescribed.  OPTIONS FOR DENTAL FOLLOW UP CARE  [place] Department of Health and Human Services - Local Safety Net Dental Clinics TripDoors.comhttp://www.ncdhhs.gov/dph/oralhealth/services/safetynetclinics.htm   [facility] [phone])  [facility] [phone]  [facility] [phone])  [facility] [phone])  [facility] [phone]) This clinic caters to the indigent population and is on a lottery system. Location: [employer] [employer] [facility], [address], [place] [facility] Hours: Wednesdays from 6pm - 9pm, patients seen by a lottery system. For dates, call or go to [email or web address] Services: Cleanings, fillings and simple extractions. Payment Options: DENTAL WORK IS FREE OF CHARGE. Bring proof of income or support. Best way to get seen: Arrive at 5:15 pm - this is a lottery, NOT first come/first serve, so arriving earlier will not increase your chances of being seen.     [facility] [phone] Select option 1 for emergencies   Location: [facility], [address], [place] [facility] Hours: No walk-ins accepted - call the day before to schedule an appointment. Check in times are 9:30 am and 1:30 pm. Services: Simple extractions, temporary fillings, pulpectomy/pulp debridement, uncomplicated abscess drainage. Payment Options: PAYMENT IS DUE AT THE TIME OF SERVICE.  Fee is usually $100-200, additional surgical procedures (e.g. abscess drainage) may be extra. Cash, checks, Visa/MasterCard accepted.  Can file Medicaid if patient is covered for dental - patient should call case worker to check. No discount for [facility] patients. Best way to get seen: MUST call the day before and get onto the schedule. Can usually be seen  the next 1-2 days. No walk-ins accepted.     [facility] [phone]   Location: [facility], [address], [place] [facility] Hours: M, W, Th, F 8am or 1:30pm, Tues 9a or 1:30 - first come/first served. Services: Simple extractions, temporary fillings, uncomplicated abscess drainage.  You do not need to be an [place] resident. Payment Options: PAYMENT IS DUE AT THE TIME OF SERVICE. Dental insurance, otherwise sliding scale - bring proof of income or support. Depending on income and treatment needed, cost is usually $50-200. Best way to get seen: Arrive early as it is first come/first served.     [facility] [phone]   Location: [address] Clinic Hours: Mon-Thu 8a-5p Services: Most basic dental services including extractions and fillings. Payment Options: PAYMENT IS DUE AT THE TIME OF SERVICE. Sliding scale, up to 50% off - bring proof if income or support. Medicaid with dental option accepted. Best way to get seen: Call to schedule an appointment, can usually be seen within 2 weeks OR they will try to see walk-ins - show up at 8a or 2p (you may have to wait).     [facility] [phone] [place]IDENTS ONLY   Location: [facility], [address], [place], [place] [address] Clinic Hours: By appointment only. Monday - Thursday 8am-5pm, Friday 8am-12pm Services: Cleanings, fillings, extractions. Payment Options: PAYMENT IS DUE AT THE TIME OF SERVICE. Cash, Visa or MasterCard. Sliding scale - $30 minimum per service. Best way to get seen: Come in to office, complete packet and make an appointment - need proof of income or support monies for each household member and proof of [place] residence. Usually takes about a month to get  in.     [facility] [phone]   Location: [place]  [place] [facility] Hours: Walk-in Urgent Care Dental Services are offered Monday-Friday mornings only. The numbers of emergencies accepted daily is limited to the number of providers available. Maximum 15 - Mondays, Wednesdays & Thursdays Maximum 10 - Tuesdays & Fridays Services: You do not need to be a [facility] resident to be seen for a dental emergency. Emergencies are defined as pain, swelling, abnormal bleeding, or dental trauma. Walkins will receive x-rays if needed. NOTE: Dental cleaning is not an emergency. Payment Options: PAYMENT IS DUE AT THE TIME OF SERVICE. Minimum co-pay is $40.00 for uninsured patients. Minimum co-pay is $3.00 for Medicaid with dental coverage. Dental Insurance is accepted and must be presented at time of visit. Medicare does not cover dental. Forms of payment: Cash, credit card, checks. Best way to get seen: If not previously registered with the clinic, walk-in dental registration begins at 7:15 am and is on a first come/first serve basis. If previously registered with the clinic, call to make an appointment.     [facility] [place] ONLY   Location: [address] [place], [place] Clinic Hours: Mon-Thu 10a-2p Services: Extractions only! Payment Options: FREE (donations accepted) - bring proof of income or support Best way to get seen: Call and schedule an appointment OR come at 8am on the 1st Monday of every month (except for holidays) when it is first come/first served.     [facility] [phone]   Location: [place], [place] Clinic Hours: Friday mornings Services, Payment Options, Best way to get seen: Call for info

## 2019-03-07 ENCOUNTER — Encounter: Payer: Self-pay | Admitting: Emergency Medicine

## 2019-03-07 ENCOUNTER — Emergency Department
Admission: EM | Admit: 2019-03-07 | Discharge: 2019-03-07 | Disposition: A | Discharge status: Home / Self Care | Payer: Self-pay | Attending: Emergency Medicine | Admitting: Emergency Medicine

## 2019-03-07 ENCOUNTER — Other Ambulatory Visit: Payer: Self-pay

## 2019-03-07 DIAGNOSIS — F17210 Nicotine dependence, cigarettes, uncomplicated: Secondary | ICD-10-CM | POA: Insufficient documentation

## 2019-03-07 DIAGNOSIS — K0889 Other specified disorders of teeth and supporting structures: Secondary | ICD-10-CM | POA: Insufficient documentation

## 2019-03-07 MED ORDER — ACETAMINOPHEN-CODEINE 120-12 MG/5ML PO SUSP: 10.0000 mL | Freq: Four times a day (QID) | ORAL | 0 refills | Status: DC | PRN

## 2019-03-07 MED ORDER — AMOXICILLIN 500 MG PO CAPS: 500.0000 mg | ORAL_CAPSULE | Freq: Three times a day (TID) | ORAL | 0 refills | Status: DC

## 2019-03-07 NOTE — Discharge Instructions (Signed)
Please call and schedule a dental appointment as soon as possible. You will need to be seen within the next 14 days. Return to the emergency department for symptoms that change or worsen if you're unable to schedule an appointment. ° °OPTIONS FOR DENTAL FOLLOW UP CARE ° °[place] Department of Health and Human Services - Local Safety Net Dental Clinics °http://www.ncdhhs.gov/dph/oralhealth/services/safetynetclinics.htm °  °[facility] ([phone]) ° °[facility] ([phone]) ° °[facility] ([phone]) ° °[facility] ([phone]) ° °[facility] ([phone]) °This clinic caters to the indigent population and is on a lottery system. °Location: °[facility], [address], [place] °Clinic Hours: °Wednesdays from 6pm - 9pm, patients seen by a lottery system. °For dates, call or go to [email or web address] °Services: °Cleanings, fillings and simple extractions. °Payment Options: °DENTAL WORK IS FREE OF CHARGE. Bring proof of income or support. °Best way to get seen: °Arrive at 5:15 pm - this is a lottery, NOT first come/first serve, so arriving earlier will not increase your chances of being seen. °  °  °[facility] °[phone] °Select option 1 for emergencies °  °Location: °[facility], [address], [place] °Clinic Hours: °No walk-ins accepted - call the day before to schedule an appointment. °Check in times are 9:30 am and 1:30 pm. °Services: °Simple extractions, temporary fillings, pulpectomy/pulp debridement, uncomplicated abscess drainage. °Payment Options: °PAYMENT IS DUE AT THE TIME OF SERVICE.  Fee is usually $100-200, additional surgical procedures (e.g. abscess drainage) may be extra. °Cash, checks, Visa/MasterCard accepted.  Can file Medicaid if patient is covered for dental - patient should call case worker to check. °No  discount for [facility] patients. °Best way to get seen: °MUST call the day before and get onto the schedule. Can usually be seen the next 1-2 days. No walk-ins accepted. °  °  °[facility] °[phone] °  °Location: °[facility], [address], [place] °Clinic Hours: °M, W, Th, F 8am or 1:30pm, Tues 9a or 1:30 - first come/first served. °Services: °Simple extractions, temporary fillings, uncomplicated abscess drainage.  You do not need to be an [place] resident. °Payment Options: °PAYMENT IS DUE AT THE TIME OF SERVICE. °Dental insurance, otherwise sliding scale - bring proof of income or support. °Depending on income and treatment needed, cost is usually $50-200. °Best way to get seen: °Arrive early as it is first come/first served. °  °  °[facility] °[phone] °  °Location: °[address] °Clinic Hours: °Mon-Thu 8a-5p °Services: °Most basic dental services including extractions and fillings. °Payment Options: °PAYMENT IS DUE AT THE TIME OF SERVICE. °Sliding scale, up to 50% off - bring proof if income or support. °Medicaid with dental option accepted. °Best way to get seen: °Call to schedule an appointment, can usually be seen within 2 weeks OR they will try to see walk-ins - show up at 8a or 2p (you may have to wait). °  °  °[facility] °[phone] °[place] RESIDENTS ONLY °  °Location: °[facility], [address], [place], [place] [address] °Clinic Hours: By appointment only. °Monday - Thursday 8am-5pm, Friday 8am-12pm °Services: Cleanings, fillings, extractions. °Payment Options: °PAYMENT IS DUE AT THE TIME OF SERVICE. °Cash, Visa or MasterCard. Sliding scale - $30 minimum per service. °Best way to get seen: °Come in to office, complete packet and make an appointment -   need proof of income °or support monies for each household member and proof of [place]  residence. °Usually takes about a month to get in. °  °  °[facility] °[phone] °  °Location: °[address] [place] °Clinic Hours: Walk-in Urgent Care Dental Services are offered Monday-Friday mornings only. °The numbers of emergencies accepted daily is limited to the number of °providers available. °Maximum 15 - Mondays, Wednesdays & Thursdays °Maximum 10 - Tuesdays & Fridays °Services: °You do not need to be a [place] resident to be seen for a dental emergency. °Emergencies are defined as pain, swelling, abnormal bleeding, or dental trauma. Walkins will receive x-rays if needed. °NOTE: Dental cleaning is not an emergency. °Payment Options: °PAYMENT IS DUE AT THE TIME OF SERVICE. °Minimum co-pay is $40.00 for uninsured patients. °Minimum co-pay is $3.00 for Medicaid with dental coverage. °Dental Insurance is accepted and must be presented at time of visit. °Medicare does not cover dental. °Forms of payment: Cash, credit card, checks. °Best way to get seen: °If not previously registered with the clinic, walk-in dental registration begins at 7:15 am and is on a first come/first serve basis. °If previously registered with the clinic, call to make an appointment. °  °  °[facility] °[phone] °[place] RESIDENTS ONLY °  °Location: °[address], [place], [place] °Clinic Hours: °Mon-Thu 10a-2p °Services: Extractions only! °Payment Options: °FREE (donations accepted) - bring proof of income or support °Best way to get seen: °Call and schedule an appointment OR come at 8am on the 1st Monday of every month (except for holidays) when it is first come/first served. °  °  °[facility] °[phone] °  °Location: °[address], [place] °Clinic Hours: °Friday mornings °Services, Payment Options, Best way to get seen: °Call for info ° °Periodontal Exam °Procedures ° ° ° ° ° °

## 2019-03-07 NOTE — ED Triage Notes (Signed)
Pt c/o pain to right lower teeth since last night. Does not have dentist. Airway intact, controlling secretions. VSS, NAD

## 2019-03-07 NOTE — ED Provider Notes (Signed)
[facility] Emergency Department Provider Note ____________________________________________  Time seen: Approximately 3:21 PM  I have reviewed the triage vital signs and the nursing notes.   HISTORY  Chief Complaint Dental Pain   HPI Teresa Sampson is a 28 y.o. female who presents to the emergency department for treatment and evaluation of dental pain.  Pain has been present for  the past couple of days with worsening over the past 24 hours.  No relief with Tylenol, ibuprofen, and salt water rinse.  She is also tried Orajel with no relief.  She denies fever.  History reviewed. No pertinent past medical history.  There are no active problems to display for this patient.   History reviewed. No pertinent surgical history.  Prior to Admission medications   Medication Sig Start Date End Date Taking? Authorizing Provider  acetaminophen-codeine 120-12 MG/5ML suspension Take 10 mLs by mouth every 6 (six) hours as needed for pain. 03/07/19 03/06/20  Clements Toro, Rulon Eisenmenger B, FNP  amoxicillin (AMOXIL) 500 MG capsule Take 1 capsule (500 mg total) by mouth 3 (three) times daily. 03/07/19   Chinita Pester, FNP    Allergies Patient has no known allergies.  History reviewed. No pertinent family history.  Social History Social History   Tobacco Use  . Smoking status: Current Every Day Smoker    Packs/day: 1.00    Types: Cigarettes  . Smokeless tobacco: Never Used  Substance Use Topics  . Alcohol use: No  . Drug use: No    Review of Systems Constitutional: Negative for fever or recent illness. ENT: Positive for dental pain. Musculoskeletal: Negative for trismus of the jaw.  Skin: Negative for wound or lesion. ____________________________________________   PHYSICAL EXAM:  VITAL SIGNS: ED Triage Vitals  Enc Vitals Group     BP 03/07/19 1302 117/72     Pulse Rate 03/07/19 1302 88     Resp 03/07/19 1302 16     Temp 03/07/19 1302 98.7 F (37.1 C)     Temp  Source 03/07/19 1302 Oral     SpO2 03/07/19 1302 100 %     Weight 03/07/19 1303 100 lb (45.4 kg)     Height 03/07/19 1303 5\' 2"  (1.575 m)     Head Circumference --      Peak Flow --      Pain Score 03/07/19 1303 8     Pain Loc --      Pain Edu? --      Excl. in GC? --     Constitutional: Alert and oriented. Well appearing and in no acute distress. Eyes: Conjunctiva are clear without discharge or drainage. Mouth/Throat: Widespread dental decay Periodontal Exam    Hematological/Lymphatic/Immunilogical: No palpable anterior cervical lymphadenopathy Respiratory: Respirations even and unlabored. Musculoskeletal: Full ROM of the jaw. Neurologic: Awake, alert, oriented.  Skin: No facial erythema or edema observed Psychiatric: Affect and behavior intact.  ____________________________________________   LABS (all labs ordered are listed, but only abnormal results are displayed)  Labs Reviewed - No data to display ____________________________________________   RADIOLOGY  Not indicated. ____________________________________________   PROCEDURES  Procedure(s) performed:   Procedures  Critical Care performed: No ____________________________________________   INITIAL IMPRESSION / ASSESSMENT AND PLAN / ED COURSE  Teresa Sampson is a 28 y.o. female presents to the emergency department for evaluation and treatment of dental pain.  She will be placed on amoxicillin and given Tylenol 3.  Patient requested capsules and suspension as she is unable to swallow tablets.  She was advised that she will need to see dentist within 2 weeks.  She was provided a list of community resources and encouraged to start calling around today for an appointment.  She was encouraged to return to the emergency department for symptoms of change or worsen if she is unable to schedule an appointment.  Pertinent labs & imaging results that were available during my care of the patient were reviewed by me and  considered in my medical decision making (see chart for details).  ____________________________________________   FINAL CLINICAL IMPRESSION(S) / ED DIAGNOSES  Final diagnoses:  Pain, dental    Discharge Medication List as of 03/07/2019  1:50 PM    START taking these medications   Details  acetaminophen-codeine 120-12 MG/5ML suspension Take 10 mLs by mouth every 6 (six) hours as needed for pain., Starting Thu 03/07/2019, Until Fri 03/06/2020, Normal    amoxicillin (AMOXIL) 500 MG capsule Take 1 capsule (500 mg total) by mouth 3 (three) times daily., Starting Thu 03/07/2019, Normal        If controlled substance prescribed during this visit, 12 month history viewed on the NCCSRS prior to issuing an initial prescription for Schedule II or III opiod.  Note:  This document was prepared using Dragon voice recognition software and may include unintentional dictation errors.   Chinita Pesterriplett, Donyell Ding B, FNP 03/07/19 1524    Sharman CheekStafford, Phillip, MD 03/10/19 Marlyne Beards0002

## 2019-03-07 NOTE — ED Notes (Signed)
See triage note  Presents with pain and some gum line swelling to right side  Min swelling noted

## 2019-04-09 ENCOUNTER — Emergency Department
Admission: EM | Admit: 2019-04-09 | Discharge: 2019-04-09 | Disposition: A | Discharge status: Home / Self Care | Payer: Self-pay | Attending: Emergency Medicine | Admitting: Emergency Medicine

## 2019-04-09 ENCOUNTER — Other Ambulatory Visit: Payer: Self-pay

## 2019-04-09 DIAGNOSIS — F17210 Nicotine dependence, cigarettes, uncomplicated: Secondary | ICD-10-CM | POA: Insufficient documentation

## 2019-04-09 DIAGNOSIS — K047 Periapical abscess without sinus: Secondary | ICD-10-CM | POA: Insufficient documentation

## 2019-04-09 MED ORDER — AMOXICILLIN 500 MG PO CAPS: 500.0000 mg | ORAL_CAPSULE | Freq: Three times a day (TID) | ORAL | 0 refills | Status: DC

## 2019-04-09 MED ORDER — IBUPROFEN 600 MG PO TABS: 600.0000 mg | ORAL_TABLET | Freq: Three times a day (TID) | ORAL | 0 refills | Status: DC | PRN

## 2019-04-09 NOTE — ED Triage Notes (Signed)
Pt comes via POV from home with c/o right sided tooth pain. Pt states this started yesterday and was worse this am. Pt states some swelling. Pt states 6/10 pain.

## 2019-04-09 NOTE — Discharge Instructions (Addendum)
Begin taking antibiotic as directed.  Amoxicillin is 3 times a day for the next 10 days.  Ibuprofen as needed for pain.  A list of dental clinics is listed on your discharge papers.  OPTIONS FOR DENTAL FOLLOW UP CARE  [place] Department of Health and Human Services - Local Safety Net Dental Clinics TripDoors.comhttp://www.ncdhhs.gov/dph/oralhealth/services/safetynetclinics.htm   [facility] [phone]  [facility] [phone])  [facility] [phone])  [facility] [phone])  [facility] [phone]) This clinic caters to the indigent population and is on a lottery system. Location: [employer] [facility], [address], [place] Clinic Hours: Wednesdays from 6pm - 9pm, patients seen by a lottery system. For dates, call or go to [email or web address] Services: Cleanings, fillings and simple extractions. Payment Options: DENTAL WORK IS FREE OF CHARGE. Bring proof of income or support. Best way to get seen: Arrive at 5:15 pm - this is a lottery, NOT first come/first serve, so arriving earlier will not increase your chances of being seen.     [facility] [phone] Select option 1 for emergencies   [facility]: [facility], [address], [place] Clinic Hours: No walk-ins accepted - call the day before to schedule an appointment. Check in times are 9:30 am and 1:30 pm. Services: Simple extractions, temporary fillings, pulpectomy/pulp debridement, uncomplicated abscess drainage. Payment Options: PAYMENT IS DUE AT THE TIME OF SERVICE.  Fee is usually $100-200, additional surgical procedures (e.g. abscess drainage) may be extra. Cash, checks, Visa/MasterCard accepted.  Can file Medicaid if patient is covered for dental - patient should call case worker to check. No discount for [facility] patients. Best way to  get seen: MUST call the day before and get onto the schedule. Can usually be seen the next 1-2 days. No walk-ins accepted.     [facility] [phone]   Location: [facility], [address], [place] Clinic Hours: M, W, Th, F 8am or 1:30pm, Tues 9a or 1:30 - first come/first served. Services: Simple extractions, temporary fillings, uncomplicated abscess drainage.  You do not need to be an [place] resident. Payment Options: PAYMENT IS DUE AT THE TIME OF SERVICE. Dental insurance, otherwise sliding scale - bring proof of income or support. Depending on income and treatment needed, cost is usually $50-200. Best way to get seen: Arrive early as it is first come/first served.     [facility] [phone]   Location: [address] Clinic Hours: Mon-Thu 8a-5p Services: Most basic dental services including extractions and fillings. Payment Options: PAYMENT IS DUE AT THE TIME OF SERVICE. Sliding scale, up to 50% off - bring proof if income or support. Medicaid with dental option accepted. Best way to get seen: Call to schedule an appointment, can usually be seen within 2 weeks OR they will try to see walk-ins - show up at 8a or 2p (you may have to wait).     [facility] [phone] [place]IDENTS ONLY   Location: [facility], [address], [place], [place] [id number] Clinic Hours: By appointment only. Monday - Thursday 8am-5pm, Friday 8am-12pm Services: Cleanings, fillings, extractions. Payment Options: PAYMENT IS DUE AT THE TIME OF SERVICE. Cash, Visa or MasterCard. Sliding scale - $30 minimum per service. Best way to get seen: Come in to office, complete packet and make an appointment - need proof of income or  support monies for each household member and proof of [facility] residence. Usually takes about a month to get in.     [facility] [phone]   Location: [address] [place] Clinic Hours: Walk-in Urgent Care Dental Services are offered Monday-Friday mornings only. The numbers of emergencies accepted daily is limited to the number of providers available. Maximum 15 - Mondays, Wednesdays & Thursdays Maximum 10 - Tuesdays & Fridays Services: You do not need to be a [facility] resident to be seen for a dental emergency. Emergencies are defined as pain, swelling, abnormal bleeding, or dental trauma. Walkins will receive x-rays if needed. NOTE: Dental cleaning is not an emergency. Payment Options: PAYMENT IS DUE AT THE TIME OF SERVICE. Minimum co-pay is $40.00 for uninsured patients. Minimum co-pay is $3.00 for Medicaid with dental coverage. Dental Insurance is accepted and must be presented at time of visit. Medicare does not cover dental. Forms of payment: Cash, credit card, checks. Best way to get seen: If not previously registered with the clinic, walk-in dental registration begins at 7:15 am and is on a first come/first serve basis. If previously registered with the clinic, call to make an appointment.     [facility] [place] ONLY   Location: [address], [place], [place] Clinic Hours: Mon-Thu 10a-2p Services: Extractions only! Payment Options: FREE (donations accepted) - bring proof of income or support Best way to get seen: Call and schedule an appointment OR come at 8am on the 1st Monday of every month (except for holidays) when it is first come/first served.     [facility] [phone]   Location: [place], [place] Clinic Hours: Friday mornings Services, Payment Options, Best way to get seen: Call for info

## 2019-04-09 NOTE — ED Provider Notes (Signed)
[facility] Emergency Department Provider Note  ____________________________________________   First MD Initiated Contact with Patient 04/09/19 1606     (approximate)  I have reviewed the triage vital signs and the nursing notes.   HISTORY  Chief Complaint Dental Pain  HPI Teresa Sampson is a 28 y.o. female Teresa Sampson to the ED with complaint of right lower dental pain.  Patient states that started yesterday but became worse this morning.  Patient has swelling but no drainage.  She rates her pain as 6/10.     History reviewed. No pertinent past medical history.  There are no active problems to display for this patient.   History reviewed. No pertinent surgical history.  Prior to Admission medications   Medication Sig Start Date End Date Taking? Authorizing Provider  amoxicillin (AMOXIL) 500 MG capsule Take 1 capsule (500 mg total) by mouth 3 (three) times daily. 04/09/19   Tommi RumpsSummers, Rhonda L, PA-C  ibuprofen (ADVIL) 600 MG tablet Take 1 tablet (600 mg total) by mouth every 8 (eight) hours as needed. 04/09/19   Tommi RumpsSummers, Rhonda L, PA-C    Allergies Patient has no known allergies.  No family history on file.  Social History Social History   Tobacco Use  . Smoking status: Current Every Day Smoker    Packs/day: 1.00    Types: Cigarettes  . Smokeless tobacco: Never Used  Substance Use Topics  . Alcohol use: No  . Drug use: No    Review of Systems Constitutional: No fever/chills Eyes: No visual changes. ENT: No sore throat.  Positive for dental pain. Cardiovascular: Denies chest pain. Respiratory: Denies shortness of breath. Gastrointestinal:  No nausea, no vomiting.  Musculoskeletal: Negative for back pain. Neurological: Negative for headaches, focal weakness or numbness. ____________________________________________   PHYSICAL EXAM:  VITAL SIGNS: ED Triage Vitals  Enc Vitals Group     BP 04/09/19 1551 108/75     Pulse Rate 04/09/19 1551  (!) 109     Resp 04/09/19 1551 19     Temp 04/09/19 1551 98.1 F (36.7 C)     Temp Source 04/09/19 1551 Oral     SpO2 04/09/19 1551 100 %     Weight 04/09/19 1552 105 lb (47.6 kg)     Height 04/09/19 1552 5\' 2"  (1.575 m)     Head Circumference --      Peak Flow --      Pain Score 04/09/19 1556 5     Pain Loc --      Pain Edu? --      Excl. in GC? --    Constitutional: Alert and oriented. Well appearing and in no acute distress. Eyes: Conjunctivae are normal.  Head: Atraumatic. Nose: No congestion/rhinnorhea. Mouth/Throat: Mucous membranes are moist.  Oropharynx non-erythematous.  Right lower molars are in very poor dental hygiene and repair.  Gums are swollen.  No obvious abscess or drainage noted at this time.  Area is extremely tender to palpation. Neck: No stridor.   Hematological/Lymphatic/Immunilogical: No cervical lymphadenopathy. Cardiovascular: Normal rate, regular rhythm. Grossly normal heart sounds.  Good peripheral circulation. Respiratory: Normal respiratory effort.  No retractions. Lungs CTAB. Musculoskeletal: Moves upper and lower extremities without any difficulty.  Normal gait was noted. Neurologic:  Normal speech and language. No gross focal neurologic deficits are appreciated. No gait instability. Skin:  Skin is warm, dry and intact. No rash noted. Psychiatric: Mood and affect are normal. Speech and behavior are normal.  ____________________________________________   LABS (all labs  ordered are listed, but only abnormal results are displayed)  Labs Reviewed - No data to display  PROCEDURES  Procedure(s) performed (including Critical Care):  Procedures   ____________________________________________   INITIAL IMPRESSION / ASSESSMENT AND PLAN / ED COURSE  As part of my medical decision making, I reviewed the following data within the electronic MEDICAL RECORD NUMBER Notes from prior ED visits and [place] Controlled Substance Database  28 year old female presents  to the ED with complaint of right-sided dental pain that began yesterday and is worse today.  She has been seen in the past for the same but has not seen a dentist for multiple dental caries.  She denies any fever, chills, nausea or vomiting.  Patient has multiple dental caries with gum edema and tenderness noted on the right lower molars.  Patient was given a prescription for amoxicillin and ibuprofen.  She was given a list of dental clinics to also follow-up with. ____________________________________________   FINAL CLINICAL IMPRESSION(S) / ED DIAGNOSES  Final diagnoses:  Dental abscess     ED Discharge Orders         Ordered    amoxicillin (AMOXIL) 500 MG capsule  3 times daily     04/09/19 1624    ibuprofen (ADVIL) 600 MG tablet  Every 8 hours PRN     04/09/19 1624           Note:  This document was prepared using Dragon voice recognition software and may include unintentional dictation errors.    Johnn Hai, PA-C 04/09/19 1649    Merlyn Lot, MD 04/09/19 [phone]

## 2021-06-01 ENCOUNTER — Emergency Department
Admission: EM | Admit: 2021-06-01 | Discharge: 2021-06-01 | Disposition: A | Discharge status: Home / Self Care | Payer: Self-pay | Attending: Emergency Medicine | Admitting: Emergency Medicine

## 2021-06-01 ENCOUNTER — Other Ambulatory Visit: Payer: Self-pay

## 2021-06-01 DIAGNOSIS — L299 Pruritus, unspecified: Secondary | ICD-10-CM | POA: Insufficient documentation

## 2021-06-01 DIAGNOSIS — F17210 Nicotine dependence, cigarettes, uncomplicated: Secondary | ICD-10-CM | POA: Insufficient documentation

## 2021-06-01 DIAGNOSIS — R21 Rash and other nonspecific skin eruption: Secondary | ICD-10-CM

## 2021-06-01 MED ORDER — DOXYCYCLINE HYCLATE 100 MG PO CAPS: 100.0000 mg | ORAL_CAPSULE | Freq: Two times a day (BID) | ORAL | 0 refills | Status: AC

## 2021-06-01 NOTE — ED Provider Notes (Signed)
[facility] Emergency Department Provider Note  ____________________________________________   Event Date/Time   First MD Initiated Contact with Patient 06/01/21 1730     (approximate)  I have reviewed the triage vital signs and the nursing notes.   HISTORY  Chief Complaint No chief complaint on file.    HPI Teresa Sampson is a 30 y.o. female here with rash.  The patient states that her symptoms started as a mildly pruritic area of redness along the left lower back.  She had been outside [place].  She states that over the last several days, she has had an increasing rash that is spread out in an ovoid area around the lesion.  She has noticed some slight itching and mild discomfort when the rash is palpated, but denies any systemic or significant painful symptoms.  Denies any fevers or chills.  No other rash.  No lesions on palms or soles.  No history of similar reactions.  No known spider exposures.  No other complaints.    No past medical history on file.  There are no problems to display for this patient.   No past surgical history on file.  Prior to Admission medications   Medication Sig Start Date End Date Taking? Authorizing Provider  doxycycline (VIBRAMYCIN) 100 MG capsule Take 1 capsule (100 mg total) by mouth 2 (two) times daily for 10 days. 06/01/21 06/11/21 Yes Shaune Pollack, MD  amoxicillin (AMOXIL) 500 MG capsule Take 1 capsule (500 mg total) by mouth 3 (three) times daily. 04/09/19   Tommi Rumps, PA-C  ibuprofen (ADVIL) 600 MG tablet Take 1 tablet (600 mg total) by mouth every 8 (eight) hours as needed. 04/09/19   Tommi Rumps, PA-C    Allergies Patient has no known allergies.  No family history on file.  Social History Social History   Tobacco Use   Smoking status: Every Day    Packs/day: 1.00    Types: Cigarettes   Smokeless tobacco: Never  Substance Use Topics   Alcohol use: No   Drug  use: No    Review of Systems  Review of Systems  Constitutional:  Negative for chills and fever.  HENT:  Negative for sore throat.   Respiratory:  Negative for shortness of breath.   Cardiovascular:  Negative for chest pain.  Gastrointestinal:  Negative for abdominal pain.  Genitourinary:  Negative for flank pain.  Musculoskeletal:  Negative for neck pain.  Skin:  Positive for rash. Negative for wound.  Allergic/Immunologic: Negative for immunocompromised state.  Neurological:  Negative for weakness and numbness.  Hematological:  Does not bruise/bleed easily.  All other systems reviewed and are negative.   ____________________________________________  PHYSICAL EXAM:      VITAL SIGNS: ED Triage Vitals  Enc Vitals Group     BP 06/01/21 1520 129/84     Pulse Rate 06/01/21 1520 (!) 102     Resp 06/01/21 1520 18     Temp 06/01/21 1520 98.3 F (36.8 C)     Temp Source 06/01/21 1520 Oral     SpO2 --      Weight 06/01/21 1521 120 lb (54.4 kg)     Height 06/01/21 1521 5\' 2"  (1.575 m)     Head Circumference --      Peak Flow --      Pain Score 06/01/21 1521 0     Pain Loc --      Pain Edu? --  Excl. in [place]? --      Physical Exam Vitals and nursing note reviewed.  Constitutional:      General: She is not in acute distress.    Appearance: She is well-developed.  HENT:     Head: Normocephalic and atraumatic.  Eyes:     Conjunctiva/sclera: Conjunctivae normal.  Cardiovascular:     Rate and Rhythm: Normal rate and regular rhythm.     Heart sounds: Normal heart sounds.  Pulmonary:     Effort: Pulmonary effort is normal. No respiratory distress.     Breath sounds: No wheezing.  Abdominal:     General: There is no distension.  Musculoskeletal:     Cervical back: Neck supple.  Skin:    General: Skin is warm.     Capillary Refill: Capillary refill takes less than 2 seconds.     Findings: No rash.  Neurological:     Mental Status: She is alert and oriented to person,  place, and time.     Motor: No abnormal muscle tone.     Left flank: erythematous rash along L flank. Small, punctate red area in center surrounded by 1-1.5 cm central clearing followed by erythematous, slightly papular rash in targetoid like distribution. ____________________________________________   LABS (all labs ordered are listed, but only abnormal results are displayed)  Labs Reviewed - No data to display  ____________________________________________  EKG:  ________________________________________  RADIOLOGY All imaging, including plain films, CT scans, and ultrasounds, independently reviewed by me, and interpretations confirmed via formal radiology reads.  ED MD interpretation:     Official radiology report(s): No results found.  ____________________________________________  PROCEDURES   Procedure(s) performed (including Critical Care):  Procedures  ____________________________________________  INITIAL IMPRESSION / MDM / ASSESSMENT AND PLAN / ED COURSE  As part of my medical decision making, I reviewed the following data within the electronic MEDICAL RECORD NUMBER Nursing notes reviewed and incorporated, Old chart reviewed, Notes from prior ED visits, and [place] Controlled Substance Database       *Teresa Sampson was evaluated in Emergency Department on 06/01/2021 for the symptoms described in the history of present illness. She was evaluated in the context of the global COVID-19 pandemic, which necessitated consideration that the patient might be at risk for infection with the SARS-CoV-2 virus that causes COVID-19. Institutional protocols and algorithms that pertain to the evaluation of patients at risk for COVID-19 are in a state of rapid change based on information released by regulatory bodies including the CDC and federal and state organizations. These policies and algorithms were followed during the patient's care in the ED.  Some ED evaluations and interventions may  be delayed as a result of limited staffing during the pandemic.*     Medical Decision Making: Well-appearing 30 year old female here with targetoid rash on the left flank concerning for tick bite/[place] tick exposure.  Differential includes RMSF or less likely Lyme given this occurred in [place].  Clinically, the patient is overall well-appearing and nontoxic.  She is afebrile.  No other rash or skin lesions.  There is no evidence of superimposed cellulitis or abscess.  Will give doxycycline twice daily for 10 days, discharged with outpatient follow-up and good return precautions.  ____________________________________________  FINAL CLINICAL IMPRESSION(S) / ED DIAGNOSES  Final diagnoses:  None     MEDICATIONS GIVEN DURING THIS VISIT:  Medications - No data to display   ED Discharge Orders          Ordered  doxycycline (VIBRAMYCIN) 100 MG capsule  2 times daily        06/01/21 1753             Note:  This document was prepared using Dragon voice recognition software and may include unintentional dictation errors.   Shaune Pollack, MD 06/01/21 [phone]

## 2021-06-01 NOTE — Discharge Instructions (Addendum)
As we discussed, your rash is likely caused by a tick bite.  Take the antibiotic as prescribed  Drink plenty of fluids

## 2021-06-01 NOTE — ED Triage Notes (Signed)
Pt reports that she noticed a bug bite on her left side a few days, they just [place] area. It does itch and it seems to be getting bigger.

## 2022-02-16 ENCOUNTER — Emergency Department
Admission: EM | Admit: 2022-02-16 | Discharge: 2022-02-17 | Disposition: A | Discharge status: Home / Self Care | Payer: Self-pay | Attending: Emergency Medicine | Admitting: Emergency Medicine

## 2022-02-16 ENCOUNTER — Other Ambulatory Visit: Payer: Self-pay

## 2022-02-16 ENCOUNTER — Encounter: Payer: Self-pay | Admitting: *Deleted

## 2022-02-16 DIAGNOSIS — F17210 Nicotine dependence, cigarettes, uncomplicated: Secondary | ICD-10-CM | POA: Insufficient documentation

## 2022-02-16 DIAGNOSIS — R45851 Suicidal ideations: Secondary | ICD-10-CM | POA: Insufficient documentation

## 2022-02-16 DIAGNOSIS — F331 Major depressive disorder, recurrent, moderate: Secondary | ICD-10-CM | POA: Diagnosis present

## 2022-02-16 DIAGNOSIS — F439 Reaction to severe stress, unspecified: Secondary | ICD-10-CM | POA: Insufficient documentation

## 2022-02-16 LAB — COMPREHENSIVE METABOLIC PANEL
ALT: 14 U/L (ref 0–44)
AST: 16 U/L (ref 15–41)
Albumin: 4 g/dL (ref 3.5–5.0)
Alkaline Phosphatase: 49 U/L (ref 38–126)
Anion gap: 4 — ABNORMAL LOW (ref 5–15)
BUN: 7 mg/dL (ref 6–20)
CO2: 27 mmol/L (ref 22–32)
Calcium: 9.1 mg/dL (ref 8.9–10.3)
Chloride: 109 mmol/L (ref 98–111)
Creatinine, Ser: 0.74 mg/dL (ref 0.44–1.00)
GFR, Estimated: >60 mL/min (ref >60)
Glucose, Bld: 100 mg/dL — ABNORMAL HIGH (ref 70–99)
Potassium: 3.7 mmol/L (ref 3.5–5.1)
Sodium: 140 mmol/L (ref 135–145)
Total Bilirubin: 0.3 mg/dL (ref 0.3–1.2)
Total Protein: 6.9 g/dL (ref 6.5–8.1)

## 2022-02-16 LAB — CBC
HCT: 39.1 % (ref 36.0–46.0)
Hemoglobin: 12.6 g/dL (ref 12.0–15.0)
MCH: 29.6 pg (ref 26.0–34.0)
MCHC: 32.2 g/dL (ref 30.0–36.0)
MCV: 92 fL (ref 80.0–100.0)
Platelets: 268 10*3/uL (ref 150–400)
RBC: 4.25 MIL/uL (ref 3.87–5.11)
RDW: 13.2 % (ref 11.5–15.5)
WBC: 9.8 10*3/uL (ref 4.0–10.5)
nRBC: 0 % (ref 0.0–0.2)

## 2022-02-16 LAB — ACETAMINOPHEN LEVEL: Acetaminophen (Tylenol), Serum: <10 ug/mL — ABNORMAL LOW (ref 10–30)

## 2022-02-16 LAB — SALICYLATE LEVEL: Salicylate Lvl: <7 mg/dL — ABNORMAL LOW (ref 7.0–30.0)

## 2022-02-16 LAB — ETHANOL: Alcohol, Ethyl (B): <10 mg/dL (ref <10)

## 2022-02-16 NOTE — ED Provider Notes (Addendum)
? ?[facility] ?Provider Note ? ? ? Event Date/Time  ? First MD Initiated Contact with Patient 02/16/22 2256   ?  (approximate) ? ? ?History  ? ?Psychiatric Evaluation ? ? ?HPI ? ?Teresa Sampson is a 31 y.o. female who presents to the ED for evaluation of Psychiatric Evaluation ?  ?Patient presents to the ED for evaluation of suicidal thoughts earlier today.  She reports having a lot of psychosocial stressors related to her recent divorce.  Reports that she was venting to a friend saying that she would rather just be asleep.  Denies any active suicidal ideations, denies any plans, denies ever attempting to take her life in the past or harm herself.  Denies any audiovisual hallucinations.  Reports feeling better now and is frustrated that her friend told her uncle who made her come get checked out. ? ?Physical Exam  ? ?Triage Vital Signs: ?ED Triage Vitals  ?Enc Vitals Group  ?   BP 02/16/22 2227 105/74  ?   Pulse Rate 02/16/22 2227 79  ?   Resp 02/16/22 2227 18  ?   Temp 02/16/22 2227 98.4 ?F (36.9 ?C)  ?   Temp Source 02/16/22 2227 Oral  ?   SpO2 02/16/22 2227 100 %  ?   Weight 02/16/22 2224 130 lb (59 kg)  ?   Height 02/16/22 2224 5\' 2"  (1.575 m)  ?   Head Circumference --   ?   Peak Flow --   ?   Pain Score 02/16/22 2224 0  ?   Pain Loc --   ?   Pain Edu? --   ?   Excl. in [place]? --   ? ? ?Most recent vital signs: ?Vitals:  ? 02/16/22 2227  ?BP: 105/74  ?Pulse: 79  ?Resp: 18  ?Temp: 98.4 ?F (36.9 ?C)  ?SpO2: 100%  ? ? ?General: Awake, no distress.  Pleasant and conversational in full sentences. ? ?CV:  Good peripheral perfusion.  ?Resp:  Normal effort.  ?Abd:  No distention.  ?MSK:  No deformity noted.  ?Neuro:  No focal deficits appreciated. ?Other:   ? ? ?ED Results / Procedures / Treatments  ? ?Labs ?(all labs ordered are listed, but only abnormal results are displayed) ?Labs Reviewed  ?COMPREHENSIVE METABOLIC PANEL - Abnormal; Notable for the following components:  ?    Result Value  ?  Glucose, Bld 100 (*)   ? Anion gap 4 (*)   ? All other components within normal limits  ?SALICYLATE LEVEL - Abnormal; Notable for the following components:  ? Salicylate Lvl Q000111Q (*)   ? All other components within normal limits  ?ACETAMINOPHEN LEVEL - Abnormal; Notable for the following components:  ? Acetaminophen (Tylenol), Serum <10 (*)   ? All other components within normal limits  ?ETHANOL  ?CBC  ?URINE DRUG SCREEN, QUALITATIVE ([facility] ONLY)  ?POC URINE PREG, ED  ? ? ?EKG ? ? ?RADIOLOGY ? ? ?Official radiology report(s): ?No results found. ? ?PROCEDURES and INTERVENTIONS: ? ?Procedures ? ?Medications - No data to display ? ? ?IMPRESSION / MDM / ASSESSMENT AND PLAN / ED COURSE  ?I reviewed the triage vital signs and the nursing notes. ? ?Well-appearing 31 year old female presents to the ED with passive suicidal thoughts in the setting of home psychosocial stressors.  She looks systemically well to me, and is conversational in full sentences.  No pressured speech, signs of mania, psychosis or self-harm.  No signs of recent illnesses or acute medical  pathology to preclude psychiatric evaluation and disposition.  We will hold her here voluntarily and have psychiatry see her.  ? ?Clinical Course as of 02/17/22 0231  ?Wed Feb 16, 2022  ?2353 The patient has been placed in psychiatric observation due to the need to provide a safe environment for the patient while obtaining psychiatric consultation and evaluation, as well as ongoing medical and medication management to treat the patient's condition.  The patient has not been placed under full IVC at this time. ? ? [DS]  ?Thu Feb 17, 2022  ?[place] NP still has not been by the ED or see the patient in the past 3 hours. I call her and she will come see the patient "soon", reports she had to write notes [DS]  ?0231 Patient seen and evaluated by psychiatric NP., cleared from their perspective for dc. We will provide [facility] f/u information and discuss return precautions [DS]   ?  ?Clinical Course User Index ?[DS] Vladimir Crofts, MD  ? ? ? ?FINAL CLINICAL IMPRESSION(S) / ED DIAGNOSES  ? ?Final diagnoses:  ?Passive suicidal ideations  ? ? ? ?Rx / DC Orders  ? ?ED Discharge Orders   ? ? None  ? ?  ? ? ? ?Note:  This document was prepared using Dragon voice recognition software and may include unintentional dictation errors. ?  ?Vladimir Crofts, MD ?02/17/22 0030 ? ?  ?Vladimir Crofts, MD ?02/17/22 0231 ? ?

## 2022-02-16 NOTE — ED Triage Notes (Signed)
Pt reports SI .  Pt denies HI.  Pt denies drug use.  Pt drank a beer yesterday.   Pt calm and cooperative.   ?

## 2022-02-16 NOTE — ED Notes (Signed)
Pt to ED for SI that has been going on for the past couple months, pt denies plan, denies HI, and AVH. Pt states her family has been worried about her and that is why she is here.  ?Pt is A&Ox4, calm and cooperative at this time  ?

## 2022-02-16 NOTE — ED Notes (Signed)
Wallace Cullens sweatshirt ?Khaki pants ?Brown boots ?Green underwear ?Wallace Cullens footies ?Black tee shirt ?Black bra ?2 ear gages ?1 stud earring ? ?

## 2022-02-17 DIAGNOSIS — F331 Major depressive disorder, recurrent, moderate: Secondary | ICD-10-CM

## 2022-02-17 NOTE — Consult Note (Signed)
[facility] Face-to-Face Psychiatry Consult   Reason for Consult:Psychiatric Evaluation  Referring Physician: Dr. Tamala Julian Patient Identification: Teresa Sampson MRN:  [id number] Principal Diagnosis: <principal problem not specified> Diagnosis:  Active Problems:   MDD (major depressive disorder), recurrent episode, moderate ([place])   Total Time spent with patient: 1 hour  Subjective:   Teresa Sampson is a 31 y.o. female patient presented to [facility] ED via POV and voluntary. Per the ED triage nurse note, Pt reports SI .  Pt denies HI.  Pt denies drug use.  Pt drank a beer yesterday. Pt calm and cooperative.    The patient was seen face-to-face by this provider; chart reviewed and consulted with Dr. Tamala Julian on 02/17/2022 due to the care of the patient. It was discussed with the EDP that the patient does not meet the criteria to be admitted to the inpatient unit.  On evaluation the patient  is alert and oriented x4, calm and cooperative, and mood-congruent with affect. The patient does not appear to be responding to internal or external stimuli. Neither is the patient presenting with any delusional thinking. The patient denies auditory or visual hallucinations. The patient denies any suicidal, homicidal, or self-harm ideations. The patient is not presenting with any psychotic or paranoid behaviors. During an encounter with the patient, he/she was able to answer questions appropriately.  HPI: Per DR. Smith. Teresa Sampson is a 31 y.o. female who presents to the ED for evaluation of Psychiatric Evaluation   Patient presents to the ED for evaluation of suicidal thoughts earlier today.  She reports having a lot of psychosocial stressors related to her recent divorce.  Reports that she was venting to a friend saying that she would rather just be asleep.  Denies any active suicidal ideations, denies any plans, denies ever attempting to take her life in the past or harm herself.  Denies any audiovisual hallucinations.   Reports feeling better now and is frustrated that her friend told her uncle who made her come get checked out.  Past Psychiatric History:   Risk to Self:   Risk to Others:   Prior Inpatient Therapy:   Prior Outpatient Therapy:    Past Medical History: No past medical history on file. No past surgical history on file. Family History: No family history on file. Family Psychiatric  History:  Social History:  Social History   Substance and Sexual Activity  Alcohol Use No     Social History   Substance and Sexual Activity  Drug Use No    Social History   Socioeconomic History   Marital status: Single    Spouse name: Not on file   Number of children: Not on file   Years of education: Not on file   Highest education level: Not on file  Occupational History   Not on file  Tobacco Use   Smoking status: Every Day    Packs/day: 1.00    Types: Cigarettes   Smokeless tobacco: Never  Substance and Sexual Activity   Alcohol use: No   Drug use: No   Sexual activity: Not on file  Other Topics Concern   Not on file  Social History Narrative   Not on file   Social Determinants of Health   Financial Resource Strain: Not on file  Food Insecurity: Not on file  Transportation Needs: Not on file  Physical Activity: Not on file  Stress: Not on file  Social Connections: Not on file   Additional Social History:  Allergies:  No Known Allergies  Labs:  Results for orders placed or performed during the hospital encounter of 02/16/22 (from the past 48 hour(s))  Comprehensive metabolic panel     Status: Abnormal   Collection Time: 02/16/22 10:32 PM  Result Value Ref Range   Sodium 140 135 - 145 mmol/L   Potassium 3.7 3.5 - 5.1 mmol/L   Chloride 109 98 - 111 mmol/L   CO2 27 22 - 32 mmol/L   Glucose, Bld 100 (H) 70 - 99 mg/dL    Comment: Glucose reference range applies only to samples taken after fasting for at least 8 hours.   BUN 7 6 - 20 mg/dL   Creatinine, Ser 0.74 0.44 -  1.00 mg/dL   Calcium 9.1 8.9 - 10.3 mg/dL   Total Protein 6.9 6.5 - 8.1 g/dL   Albumin 4.0 3.5 - 5.0 g/dL   AST 16 15 - 41 U/L   ALT 14 0 - 44 U/L   Alkaline Phosphatase 49 38 - 126 U/L   Total Bilirubin 0.3 0.3 - 1.2 mg/dL   GFR, Estimated >60 >60 mL/min    Comment: (NOTE) Calculated using the CKD-EPI Creatinine Equation (2021)    Anion gap 4 (L) 5 - 15    Comment: Performed at [facility], [address], [place], [place] [address]  Ethanol     Status: None   Collection Time: 02/16/22 10:32 PM  Result Value Ref Range   Alcohol, Ethyl (B) <10 <10 mg/dL    Comment: (NOTE) Lowest detectable limit for serum alcohol is 10 mg/dL.  For medical purposes only. Performed at [facility], [address] [place], [place] [id number]   Salicylate level     Status: Abnormal   Collection Time: 02/16/22 10:32 PM  Result Value Ref Range   Salicylate Lvl Q000111Q (L) 7.0 - 30.0 mg/dL    Comment: Performed at [facility], [address] [place], [place] [address]  Acetaminophen level     Status: Abnormal   Collection Time: 02/16/22 10:32 PM  Result Value Ref Range   Acetaminophen (Tylenol), Serum <10 (L) 10 - 30 ug/mL    Comment: (NOTE) Therapeutic concentrations vary significantly. A range of 10-30 ug/mL  may be an effective concentration for many patients. However, some  are best treated at concentrations outside of this range. Acetaminophen concentrations >150 ug/mL at 4 hours after ingestion  and >50 ug/mL at 12 hours after ingestion are often associated with  toxic reactions.  Performed at [facility], [address] [place], [place] [address]   cbc     Status: None   Collection Time: 02/16/22 10:32 PM  Result Value Ref Range   WBC 9.8 4.0 - 10.5 K/uL   RBC 4.25 3.87 - 5.11 MIL/uL   Hemoglobin 12.6 12.0 - 15.0 g/dL   HCT 39.1 36.0 - 46.0 %   MCV 92.0 80.0 - 100.0 fL   MCH 29.6 26.0 - 34.0 pg   MCHC 32.2 30.0 - 36.0 g/dL   RDW 13.2  11.5 - 15.5 %   Platelets 268 150 - 400 K/uL   nRBC 0.0 0.0 - 0.2 %    Comment: Performed at [facility], [address] [place], [place] [address]    No current facility-administered medications for this encounter.   No current outpatient medications on file.    Musculoskeletal: Strength & Muscle Tone: within normal limits Gait & Station: normal Patient leans: N/A Psychiatric Specialty Exam:  Presentation  General Appearance: No data recorded Eye Contact:No data recorded Speech:No data recorded Speech Volume:No data recorded Handedness:No data recorded  Mood and Affect  Mood:Anxious  Affect:Congruent   Thought Process  Thought Processes:Coherent  Descriptions of Associations:Intact  Orientation:Full (Time, Place and Person)  Thought Content:Logical  History of Schizophrenia/Schizoaffective disorder:No data recorded Duration of Psychotic Symptoms:No data recorded Hallucinations:Hallucinations: None  Ideas of Reference:None  Suicidal Thoughts:Suicidal Thoughts: No  Homicidal Thoughts:Homicidal Thoughts: No   Sensorium  Memory:Immediate Good; Remote Good; Recent Good  Judgment:Fair  Insight:Fair   Executive Functions  Concentration:Fair  Attention Span:Fair  Recall:Fair; [place] of Knowledge:Good  Language:Good   Psychomotor Activity  Psychomotor Activity:Psychomotor Activity: Normal   Assets  Assets:Communication Skills; Leisure Time; Desire for Improvement; Resilience; Social Support   Sleep  Sleep:Sleep: Good Number of Hours of Sleep: 8   Physical Exam: Physical Exam Vitals and nursing note reviewed.  Constitutional:      Appearance: Normal appearance. She is normal weight.  HENT:     Head: Normocephalic and atraumatic.     Right Ear: External ear normal.     Left Ear: External ear normal.     Nose: Nose normal.  Cardiovascular:     Rate and Rhythm: Bradycardia present.  Pulmonary:     Effort: Pulmonary  effort is normal.  Musculoskeletal:        General: Normal range of motion.     Cervical back: Normal range of motion.  Neurological:     General: No focal deficit present.     Mental Status: She is alert and oriented to person, place, and time. Mental status is at baseline.  Psychiatric:        Attention and Perception: Attention and perception normal.        Mood and Affect: Mood is anxious.        Speech: Speech normal.        Behavior: Behavior normal. Behavior is cooperative.        Thought Content: Thought content normal.        Cognition and Memory: Cognition and memory normal.        Judgment: Judgment normal.   Review of Systems  Psychiatric/Behavioral:  Positive for depression. The patient is nervous/anxious.   All other systems reviewed and are negative. Blood pressure 114/66, pulse (!) 55, temperature 98.4 F (36.9 C), temperature source Oral, resp. rate 18, height 5\' 2"  (1.575 m), weight 59 kg, last menstrual period 02/02/2022, SpO2 98 %. Body mass index is 23.78 kg/m.  Treatment Plan Summary: Plan The patient is not a safety risk to himself or others and does not require psychiatric inpatient admission for stabilization and treatment.  Disposition: No evidence of imminent risk to self or others at present.   Patient does not meet criteria for psychiatric inpatient admission. Supportive therapy provided about ongoing stressors. Discussed crisis plan, support from social network, calling 911, coming to the Emergency Department, and calling Suicide Hotline.  Caroline Sauger, NP 02/17/2022 3:39 AM

## 2023-01-08 ENCOUNTER — Emergency Department: Payer: 59

## 2023-01-08 ENCOUNTER — Other Ambulatory Visit: Payer: Self-pay

## 2023-01-08 ENCOUNTER — Emergency Department
Admission: EM | Admit: 2023-01-08 | Discharge: 2023-01-08 | Disposition: A | Discharge status: Home / Self Care | Payer: 59 | Attending: Emergency Medicine | Admitting: Emergency Medicine

## 2023-01-08 DIAGNOSIS — R509 Fever, unspecified: Secondary | ICD-10-CM | POA: Diagnosis not present

## 2023-01-08 DIAGNOSIS — R002 Palpitations: Secondary | ICD-10-CM | POA: Diagnosis not present

## 2023-01-08 DIAGNOSIS — J020 Streptococcal pharyngitis: Secondary | ICD-10-CM | POA: Diagnosis not present

## 2023-01-08 DIAGNOSIS — Z20822 Contact with and (suspected) exposure to covid-19: Secondary | ICD-10-CM | POA: Insufficient documentation

## 2023-01-08 DIAGNOSIS — R000 Tachycardia, unspecified: Secondary | ICD-10-CM | POA: Diagnosis not present

## 2023-01-08 LAB — COMPREHENSIVE METABOLIC PANEL
ALT: 11 U/L (ref 0–44)
AST: 17 U/L (ref 15–41)
Albumin: 4.5 g/dL (ref 3.5–5.0)
Alkaline Phosphatase: 68 U/L (ref 38–126)
Anion gap: 11 (ref 5–15)
BUN: 11 mg/dL (ref 6–20)
CO2: 20 mmol/L — ABNORMAL LOW (ref 22–32)
Calcium: 8.9 mg/dL (ref 8.9–10.3)
Chloride: 106 mmol/L (ref 98–111)
Creatinine, Ser: 0.73 mg/dL (ref 0.44–1.00)
GFR, Estimated: >60 mL/min (ref >60)
Glucose, Bld: 117 mg/dL — ABNORMAL HIGH (ref 70–99)
Potassium: 2.8 mmol/L — ABNORMAL LOW (ref 3.5–5.1)
Sodium: 137 mmol/L (ref 135–145)
Total Bilirubin: 0.7 mg/dL (ref 0.3–1.2)
Total Protein: 7.5 g/dL (ref 6.5–8.1)

## 2023-01-08 LAB — CBC WITH DIFFERENTIAL/PLATELET
Abs Immature Granulocytes: 0.06 10*3/uL (ref 0.00–0.07)
Basophils Absolute: 0.1 10*3/uL (ref 0.0–0.1)
Basophils Relative: 0 %
Eosinophils Absolute: 0.1 10*3/uL (ref 0.0–0.5)
Eosinophils Relative: 1 %
HCT: 37.4 % (ref 36.0–46.0)
Hemoglobin: 12.8 g/dL (ref 12.0–15.0)
Immature Granulocytes: 0 %
Lymphocytes Relative: 14 %
Lymphs Abs: 2.5 10*3/uL (ref 0.7–4.0)
MCH: 30.9 pg (ref 26.0–34.0)
MCHC: 34.2 g/dL (ref 30.0–36.0)
MCV: 90.3 fL (ref 80.0–100.0)
Monocytes Absolute: 1.2 10*3/uL — ABNORMAL HIGH (ref 0.1–1.0)
Monocytes Relative: 7 %
Neutro Abs: 13.8 10*3/uL — ABNORMAL HIGH (ref 1.7–7.7)
Neutrophils Relative %: 78 %
Platelets: 317 10*3/uL (ref 150–400)
RBC: 4.14 MIL/uL (ref 3.87–5.11)
RDW: 13.2 % (ref 11.5–15.5)
WBC: 17.7 10*3/uL — ABNORMAL HIGH (ref 4.0–10.5)
nRBC: 0 % (ref 0.0–0.2)

## 2023-01-08 LAB — URINALYSIS, ROUTINE W REFLEX MICROSCOPIC
Bilirubin Urine: NEGATIVE
Glucose, UA: NEGATIVE mg/dL
Hgb urine dipstick: NEGATIVE
Ketones, ur: NEGATIVE mg/dL
Leukocytes,Ua: NEGATIVE
Nitrite: NEGATIVE
Protein, ur: NEGATIVE mg/dL
Specific Gravity, Urine: 1.003 — ABNORMAL LOW (ref 1.005–1.030)
pH: 6 (ref 5.0–8.0)

## 2023-01-08 LAB — RESP PANEL BY RT-PCR (RSV, FLU A&B, COVID)  RVPGX2
Influenza A by PCR: NEGATIVE
Influenza B by PCR: NEGATIVE
Resp Syncytial Virus by PCR: NEGATIVE
SARS Coronavirus 2 by RT PCR: NEGATIVE

## 2023-01-08 LAB — GROUP A STREP BY PCR: Group A Strep by PCR: NOT DETECTED

## 2023-01-08 LAB — LACTIC ACID, PLASMA: Lactic Acid, Venous: 1.3 mmol/L (ref 0.5–1.9)

## 2023-01-08 LAB — TROPONIN I (HIGH SENSITIVITY): Troponin I (High Sensitivity): 3 ng/L (ref <18)

## 2023-01-08 MED ORDER — SODIUM CHLORIDE 0.9 % IV BOLUS
1000.0000 mL | Freq: Once | INTRAVENOUS | Status: AC
  Administered 2023-01-08: 1000 mL via INTRAVENOUS

## 2023-01-08 MED ORDER — LIDOCAINE VISCOUS HCL 2 % MT SOLN: 10.0000 mL | OROMUCOSAL | 0 refills | Status: AC | PRN

## 2023-01-08 MED ORDER — POTASSIUM CHLORIDE 10 MEQ/100ML IV SOLN
10.0000 meq | Freq: Once | INTRAVENOUS | Status: AC
  Administered 2023-01-08: 10 meq via INTRAVENOUS
  Filled 2023-01-08: qty 100

## 2023-01-08 MED ORDER — AMOXICILLIN 500 MG PO CAPS
1000.0000 mg | ORAL_CAPSULE | Freq: Once | ORAL | Status: AC
  Administered 2023-01-08: 1000 mg via ORAL
  Filled 2023-01-08: qty 2

## 2023-01-08 MED ORDER — AMOXICILLIN 875 MG PO TABS: 875.0000 mg | ORAL_TABLET | Freq: Two times a day (BID) | ORAL | 0 refills | Status: AC

## 2023-01-08 MED ORDER — POTASSIUM CHLORIDE CRYS ER 20 MEQ PO TBCR
40.0000 meq | EXTENDED_RELEASE_TABLET | Freq: Once | ORAL | Status: AC
  Administered 2023-01-08: 40 meq via ORAL
  Filled 2023-01-08: qty 2

## 2023-01-08 NOTE — ED Triage Notes (Signed)
Pt to ED from home with sore throat that started this morning. Pt has some generalized body aches as well and low grade fever. Pt is CAOx4 and in no acute distress and ambulatory in triage.

## 2023-01-08 NOTE — ED Notes (Addendum)
EKG reviewed by Dr Kerman Passey.

## 2023-01-08 NOTE — ED Provider Notes (Signed)
[facility] Provider Note  Patient Contact: 8:21 PM (approximate)   History   Sore Throat (Since this morning)   HPI  Teresa Sampson is a 32 y.o. female who presents emergency department complaining of fever, body aches, sore throat, palpitations.  Patient presented to the ED stating that others in the house have had similar symptoms.  She denies any chest pain, shortness of breath, abdominal pain.  Patient arrived triage with a temperature of 99.6 heart rate of 150.  No cardiac history.  Patient does endorse palpitations but again no pain.     Physical Exam   Triage Vital Signs: ED Triage Vitals  Enc Vitals Group     BP 01/08/23 2004 125/72     Pulse Rate 01/08/23 2004 (!) 150     Resp 01/08/23 2004 18     Temp 01/08/23 2004 99.6 F (37.6 C)     Temp src --      SpO2 01/08/23 2004 99 %     Weight 01/08/23 2005 130 lb 1.1 oz (59 kg)     Height 01/08/23 2005 5\' 2"  (1.575 m)     Head Circumference --      Peak Flow --      Pain Score 01/08/23 2004 3     Pain Loc --      Pain Edu? --      Excl. in [place]? --     Most recent vital signs: Vitals:   01/08/23 2013 01/08/23 2228  BP: 115/78   Pulse: (!) 128 96  Resp: 16   Temp:    SpO2: 100%      General: Alert and in no acute distress. ENT:      Ears:       Nose: No congestion/rhinnorhea.      Mouth/Throat: Mucous membranes are moist.  Tonsils are erythematous and slightly edematous bilaterally.  Exudates.  Uvula is midline. Neck: No stridor. No cervical spine tenderness to palpation. Hematological/Lymphatic/Immunilogical: No cervical lymphadenopathy. Cardiovascular:  Good peripheral perfusion.  Auscultation reveals significantly fast heart rate audibly 140s to 160s beats per minute.  No appreciable murmur. Respiratory: Normal respiratory effort without tachypnea or retractions. Lungs CTAB. Good air entry to the bases with no decreased or absent breath sounds Musculoskeletal: Full range of  motion to all extremities.  Neurologic:  No gross focal neurologic deficits are appreciated.  Skin:   No rash noted Other:   ED Results / Procedures / Treatments   Labs (all labs ordered are listed, but only abnormal results are displayed) Labs Reviewed  COMPREHENSIVE METABOLIC PANEL - Abnormal; Notable for the following components:      Result Value   Potassium 2.8 (*)    CO2 20 (*)    Glucose, Bld 117 (*)    All other components within normal limits  CBC WITH DIFFERENTIAL/PLATELET - Abnormal; Notable for the following components:   WBC 17.7 (*)    Neutro Abs 13.8 (*)    Monocytes Absolute 1.2 (*)    All other components within normal limits  URINALYSIS, ROUTINE W REFLEX MICROSCOPIC - Abnormal; Notable for the following components:   Color, Urine STRAW (*)    APPearance CLEAR (*)    Specific Gravity, Urine 1.003 (*)    All other components within normal limits  RESP PANEL BY RT-PCR (RSV, FLU A&B, COVID)  [id number]  GROUP A STREP BY PCR  LACTIC ACID, PLASMA  TROPONIN I (HIGH SENSITIVITY)     EKG  ED ECG REPORT I, Charline Bills Mahkayla Preece,  personally viewed and interpreted this ECG.   Date: 01/08/2023  EKG Time: 2022 hrs.  Rate: 21 bpm  Rhythm: there are no previous tracings available for comparison, sinus tachycardia  Axis: Normal axis  Intervals:none  ST&T Change: No gross ST elevation or depression noted  Sinus tachycardia with no evidence of STEMI.  No previous EKG for comparison.    RADIOLOGY  I personally viewed, evaluated, and interpreted these images as part of my medical decision making, as well as reviewing the written report by the radiologist.  ED Provider Interpretation: Visualization of x-ray reveals no acute cardiopulmonary findings  DG Chest 2 View  Result Date: 01/08/2023 CLINICAL DATA:  Generalized body aches, fever, and sore throat EXAM: CHEST - 2 VIEW COMPARISON:  None Available. FINDINGS: Hyperinflated lungs. No focal consolidations. No pleural  effusion or pneumothorax. The heart size and mediastinal contours are within normal limits. The visualized skeletal structures are unremarkable. IMPRESSION: No active cardiopulmonary disease. Electronically Signed   By: Darrin Nipper M.D.   On: 01/08/2023 20:39    PROCEDURES:  Critical Care performed: No  Procedures   MEDICATIONS ORDERED IN ED: Medications  amoxicillin (AMOXIL) capsule 1,000 mg (has no administration in time range)  sodium chloride 0.9 % bolus 1,000 mL (0 mLs Intravenous Stopped 01/08/23 2226)  potassium chloride 10 mEq in 100 mL IVPB (10 mEq Intravenous New Bag/Given 01/08/23 2224)  potassium chloride SA (KLOR-CON M) CR tablet 40 mEq (40 mEq Oral Given 01/08/23 2224)     IMPRESSION / MDM / ASSESSMENT AND PLAN / ED COURSE  I reviewed the triage vital signs and the nursing notes.                                 Differential diagnosis includes, but is not limited to, SVT, sinus tachycardia, A-fib with RVR, strep, peritonsillar abscess, retropharyngeal abscess, sepsis, flu, COVID, viral pharyngitis  Patient's presentation is most consistent with acute presentation with potential threat to life or bodily function.   Patient's diagnosis is consistent with strep.  Patient presents emergency department sore throat, fever.  She arrived afebrile but significantly tachycardic.  She states that she had used 3 different sources of caffeine, as well as having whitecoat syndrome.  However given the elevated heart rate patient had labs, EKG, chest x-ray.  Patient had sinus tachycardia that slowly resolved without medical management and is currently in the mid 80s at bedside.  Patient has reassuring troponin, chest x-ray.  Do not suspect cardiac source for tachycardia.  Patient does have findings on physical exam consistent with strep.  Strep test was performed but was not validated at the lab.  I will treat empirically at this time.  Labs and workup were otherwise reassuring I feel the  patient is stable.  I think that patient had anxiety about her encounter, pain, as well as significant caffeine use resulting and significant tachycardia.  I do not feel that patient requires admission at this time for sepsis, cardiac workup.  However patient has return of of any chest pain, significant tachycardia or palpitation she should return to the ED.  Follow-up primary care as needed..  Patient is given ED precautions to return to the ED for any worsening or new symptoms.     FINAL CLINICAL IMPRESSION(S) / ED DIAGNOSES   Final diagnoses:  Strep throat     Rx / DC Orders  ED Discharge Orders          Ordered    amoxicillin (AMOXIL) 875 MG tablet  2 times daily        01/08/23 2334    lidocaine (XYLOCAINE) 2 % solution  Every 4 hours PRN        01/08/23 2334             Note:  This document was prepared using Dragon voice recognition software and may include unintentional dictation errors.   Brynda Peon 01/08/23 2334    Blake Divine, MD 01/10/23 [phone]

## 2024-10-15 ENCOUNTER — Ambulatory Visit: Admitting: Family Medicine

## 2024-10-15 VITALS — BP 123/88 | HR 108 | Resp 16 | Ht 60.1 in | Wt 132.4 lb

## 2024-10-15 DIAGNOSIS — F331 Major depressive disorder, recurrent, moderate: Secondary | ICD-10-CM

## 2024-10-15 DIAGNOSIS — F411 Generalized anxiety disorder: Secondary | ICD-10-CM

## 2024-10-15 DIAGNOSIS — R5383 Other fatigue: Secondary | ICD-10-CM

## 2024-10-15 DIAGNOSIS — Z0000 Encounter for general adult medical examination without abnormal findings: Secondary | ICD-10-CM

## 2024-10-15 DIAGNOSIS — Z7689 Persons encountering health services in other specified circumstances: Secondary | ICD-10-CM

## 2024-10-16 LAB — COMPREHENSIVE METABOLIC PANEL WITH GFR
ALT: 24 IU/L (ref 0–32)
AST: 20 IU/L (ref 0–40)
Albumin: 4.6 g/dL (ref 3.9–4.9)
Alkaline Phosphatase: 69 IU/L (ref 41–116)
BUN/Creatinine Ratio: 6 — ABNORMAL LOW (ref 9–23)
BUN: 6 mg/dL (ref 6–20)
Bilirubin Total: 0.4 mg/dL (ref 0.0–1.2)
CO2: 23 mmol/L (ref 20–29)
Calcium: 9.4 mg/dL (ref 8.7–10.2)
Chloride: 103 mmol/L (ref 96–106)
Creatinine, Ser: 0.95 mg/dL (ref 0.57–1.00)
Globulin, Total: 2.7 g/dL (ref 1.5–4.5)
Glucose: 73 mg/dL (ref 70–99)
Potassium: 4.2 mmol/L (ref 3.5–5.2)
Sodium: 140 mmol/L (ref 134–144)
Total Protein: 7.3 g/dL (ref 6.0–8.5)
eGFR: 82 mL/min/1.73

## 2024-10-16 LAB — CBC WITH DIFFERENTIAL/PLATELET
Basophils Absolute: 0 x10E3/uL (ref 0.0–0.2)
Basos: 1 %
EOS (ABSOLUTE): 0.1 x10E3/uL (ref 0.0–0.4)
Eos: 1 %
Hematocrit: 46.9 % — ABNORMAL HIGH (ref 34.0–46.6)
Hemoglobin: 15.8 g/dL (ref 11.1–15.9)
Immature Grans (Abs): 0 x10E3/uL (ref 0.0–0.1)
Immature Granulocytes: 0 %
Lymphocytes Absolute: 2.1 x10E3/uL (ref 0.7–3.1)
Lymphs: 40 %
MCH: 31 pg (ref 26.6–33.0)
MCHC: 33.7 g/dL (ref 31.5–35.7)
MCV: 92 fL (ref 79–97)
Monocytes Absolute: 0.6 x10E3/uL (ref 0.1–0.9)
Monocytes: 11 %
Neutrophils Absolute: 2.5 x10E3/uL (ref 1.4–7.0)
Neutrophils: 47 %
Platelets: 346 x10E3/uL (ref 150–450)
RBC: 5.09 x10E6/uL (ref 3.77–5.28)
RDW: 12.4 % (ref 11.7–15.4)
WBC: 5.3 x10E3/uL (ref 3.4–10.8)

## 2024-10-16 LAB — LIPID PANEL
Chol/HDL Ratio: 6.6 ratio — ABNORMAL HIGH (ref 0.0–4.4)
Cholesterol, Total: 199 mg/dL (ref 100–199)
HDL: 30 mg/dL — ABNORMAL LOW
LDL Chol Calc (NIH): 141 mg/dL — ABNORMAL HIGH (ref 0–99)
Triglycerides: 155 mg/dL — ABNORMAL HIGH (ref 0–149)
VLDL Cholesterol Cal: 28 mg/dL (ref 5–40)

## 2024-10-16 LAB — TSH RFX ON ABNORMAL TO FREE T4: TSH: 1.6 u[IU]/mL (ref 0.450–4.500)

## 2024-10-16 LAB — VITAMIN D 25 HYDROXY (VIT D DEFICIENCY, FRACTURES): Vit D, 25-Hydroxy: 11 ng/mL — ABNORMAL LOW (ref 30.0–100.0)

## 2024-10-16 LAB — HEMOGLOBIN A1C
Est. average glucose Bld gHb Est-mCnc: 105 mg/dL
Hgb A1c MFr Bld: 5.3 % (ref 4.8–5.6)

## 2024-10-16 LAB — VITAMIN B12: Vitamin B-12: 730 pg/mL (ref 232–1245)

## 2024-10-17 ENCOUNTER — Ambulatory Visit: Payer: Self-pay | Admitting: Family Medicine

## 2024-10-17 DIAGNOSIS — E559 Vitamin D deficiency, unspecified: Secondary | ICD-10-CM

## 2024-10-17 MED ORDER — VITAMIN D (ERGOCALCIFEROL) 1.25 MG (50000 UNIT) PO CAPS: 50000.0000 [IU] | ORAL_CAPSULE | ORAL | 0 refills | Status: AC

## 2024-11-11 ENCOUNTER — Telehealth: Payer: Self-pay | Admitting: Family Medicine

## 2024-11-11 NOTE — Telephone Encounter (Signed)
 Left vm appt reminder for appt on 11/12/2024 at 11:10 AM

## 2024-11-11 NOTE — Telephone Encounter (Signed)
 Called to r/s pt as provider is sick and will not be in office on 11/12/2024

## 2024-11-12 ENCOUNTER — Ambulatory Visit: Admitting: Family Medicine

## 2024-11-12 ENCOUNTER — Telehealth: Admitting: Family Medicine

## 2024-11-18 ENCOUNTER — Ambulatory Visit: Admitting: Family Medicine
